# Patient Record
Sex: Male | Born: 1995 | Race: Black or African American | Hispanic: No | Marital: Single | Smoking: Former smoker
Health system: Southern US, Community
[De-identification: ages and names within clinical notes are randomized; demographics above are authoritative.]

## PROBLEM LIST (undated history)

## (undated) DIAGNOSIS — J45909 Unspecified asthma, uncomplicated: Secondary | ICD-10-CM

## (undated) HISTORY — DX: Unspecified asthma, uncomplicated: J45.909

---
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
## 2016-11-18 DIAGNOSIS — J101 Influenza due to other identified influenza virus with other respiratory manifestations: Secondary | ICD-10-CM | POA: Insufficient documentation

## 2018-07-20 ENCOUNTER — Ambulatory Visit: Payer: BLUE CROSS/BLUE SHIELD | Admitting: Family Medicine

## 2018-07-20 ENCOUNTER — Encounter: Payer: Self-pay | Admitting: Family Medicine

## 2018-07-20 VITALS — BP 94/58 | HR 72 | Temp 98.1°F | Ht 67.0 in | Wt 141.0 lb

## 2018-07-20 DIAGNOSIS — Z8709 Personal history of other diseases of the respiratory system: Secondary | ICD-10-CM

## 2018-07-20 DIAGNOSIS — Z0000 Encounter for general adult medical examination without abnormal findings: Secondary | ICD-10-CM

## 2018-07-20 NOTE — Patient Instructions (Signed)
Preventive Care 18-39 Years, Male Preventive care refers to lifestyle choices and visits with your health care provider that can promote health and wellness. What does preventive care include?  A yearly physical exam. This is also called an annual well check.  Dental exams once or twice a year.  Routine eye exams. Ask your health care provider how often you should have your eyes checked.  Personal lifestyle choices, including: ? Daily care of your teeth and gums. ? Regular physical activity. ? Eating a healthy diet. ? Avoiding tobacco and drug use. ? Limiting alcohol use. ? Practicing safe sex. What happens during an annual well check? The services and screenings done by your health care provider during your annual well check will depend on your age, overall health, lifestyle risk factors, and family history of disease. Counseling Your health care provider may ask you questions about your:  Alcohol use.  Tobacco use.  Drug use.  Emotional well-being.  Home and relationship well-being.  Sexual activity.  Eating habits.  Work and work Statistician.  Screening You may have the following tests or measurements:  Height, weight, and BMI.  Blood pressure.  Lipid and cholesterol levels. These may be checked every 5 years starting at age 34.  Diabetes screening. This is done by checking your blood sugar (glucose) after you have not eaten for a while (fasting).  Skin check.  Hepatitis C blood test.  Hepatitis B blood test.  Sexually transmitted disease (STD) testing.  Discuss your test results, treatment options, and if necessary, the need for more tests with your health care provider. Vaccines Your health care provider may recommend certain vaccines, such as:  Influenza vaccine. This is recommended every year.  Tetanus, diphtheria, and acellular pertussis (Tdap, Td) vaccine. You may need a Td booster every 10 years.  Varicella vaccine. You may need this if you  have not been vaccinated.  HPV vaccine. If you are 23 or younger, you may need three doses over 6 months.  Measles, mumps, and rubella (MMR) vaccine. You may need at least one dose of MMR.You may also need a second dose.  Pneumococcal 13-valent conjugate (PCV13) vaccine. You may need this if you have certain conditions and have not been vaccinated.  Pneumococcal polysaccharide (PPSV23) vaccine. You may need one or two doses if you smoke cigarettes or if you have certain conditions.  Meningococcal vaccine. One dose is recommended if you are age 65-21 years and a first-year college student living in a residence hall, or if you have one of several medical conditions. You may also need additional booster doses.  Hepatitis A vaccine. You may need this if you have certain conditions or if you travel or work in places where you may be exposed to hepatitis A.  Hepatitis B vaccine. You may need this if you have certain conditions or if you travel or work in places where you may be exposed to hepatitis B.  Haemophilus influenzae type b (Hib) vaccine. You may need this if you have certain risk factors.  Talk to your health care provider about which screenings and vaccines you need and how often you need them. This information is not intended to replace advice given to you by your health care provider. Make sure you discuss any questions you have with your health care provider. Document Released: 11/29/2001 Document Revised: 06/22/2016 Document Reviewed: 08/04/2015 Elsevier Interactive Patient Education  [employer].

## 2018-07-20 NOTE — Progress Notes (Signed)
Subjective:     Leonard Pham is a 22 y.o. male and is here for a comprehensive physical exam. The patient reports problems - h/o asthma.  Pt notes prior h/o asthma, no recent symptoms.  Pt had an albuterol inhaler, but is not using it.  Pt is hoping to enlist in [employer] next year.  Per pt he needs a methacholine challenge test in order to proceed.   Allergies: NKDA  Social hx:  Pt is single.  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  Pt endorses rare EtOH use.  Pt denies tobacco and drug use.  Family medical history: Mom-alive, asthma Dad-alive, HTN MGM-at arthritis, HLD MGF-asthma PGM-HLD, HTN PGF-HLD, HTN  Social History   Socioeconomic History  . Marital status: Single    Spouse name: Not on file  . Number of children: Not on file  . Years of education: Not on file  . Highest education level: Not on file  Occupational History  . Not on file  Social Needs  . Financial resource strain: Not on file  . Food insecurity:    Worry: Not on file    Inability: Not on file  . Transportation needs:    Medical: Not on file    Non-medical: Not on file  Tobacco Use  . Smoking status: Never Smoker  . Smokeless tobacco: Never Used  Substance and Sexual Activity  . Alcohol use: Yes    Alcohol/week: 0.0 standard drinks  . Drug use: No  . Sexual activity: Not on file  Lifestyle  . Physical activity:    Days per week: Not on file    Minutes per session: Not on file  . Stress: Not on file  Relationships  . Social connections:    Talks on phone: Not on file    Gets together: Not on file    Attends religious service: Not on file    Active member of club or organization: Not on file    Attends meetings of clubs or organizations: Not on file    Relationship status: Not on file  . Intimate partner violence:    Fear of current or ex partner: Not on file    Emotionally abused: Not on file    Physically abused: Not on file    Forced sexual activity: Not on  file  Other Topics Concern  . Not on file  Social History Narrative  . Not on file   Health Maintenance  Topic Date Due  . HIV Screening  03/01/2012  . INFLUENZA VACCINE  05/17/2018  . TETANUS/TDAP  05/12/2025    The following portions of the patient's history were reviewed and updated as appropriate: allergies, current medications, past family history, past medical history, past social history, past surgical history and problem list.  Review of Systems Pertinent items noted in HPI and remainder of comprehensive ROS otherwise negative.   Objective:    BP (!) 94/58 (BP Location: Left Arm, Patient Position: Sitting, Cuff Size: Normal)   Pulse 72   Temp 98.1 F (36.7 C) (Oral)   Ht 5\' 7"  (1.702 m)   Wt 141 lb (64 kg)   SpO2 98%   BMI 22.08 kg/m  General appearance: alert, cooperative, appears stated age and no distress Head: Normocephalic, without obvious abnormality, atraumatic Eyes: conjunctivae/corneas clear. PERRL, EOM's intact. Fundi benign. Ears: normal TM's and external ear canals both ears Nose: Nares normal. Septum midline. Mucosa normal. No drainage or sinus tenderness., no sinus  tenderness Throat: lips, mucosa, and tongue normal; teeth and gums normal Neck: no adenopathy, no carotid bruit, no JVD, supple, symmetrical, trachea midline and thyroid not enlarged, symmetric, no tenderness/mass/nodules Lungs: clear to auscultation bilaterally Heart: regular rate and rhythm, S1, S2 normal, no murmur, click, rub or gallop Abdomen: soft, non-tender; bowel sounds normal; no masses,  no organomegaly Extremities: extremities normal, atraumatic, no cyanosis or edema Skin: Skin color, texture, turgor normal. No rashes or lesions Neurologic: Alert and oriented X 3, normal strength and tone. Normal symmetric reflexes. Normal coordination and gait    Assessment:    Healthy male exam.      Plan:     Anticipatory guidance given including wearing seatbelts, smoke detectors in the  home, increasing physical activity, increasing p.o. intake of water and vegetables. -declined influenza vaccine -given handout -next CPE in 1 yr See After Visit Summary for Counseling Recommendations    H/o Asthma  -referral to pulm placed for methacholine challenge testing or other testing required for pt to enlist in the [employer].  F/u prn  Abbe Amsterdam, MD

## 2018-07-27 ENCOUNTER — Encounter: Payer: Self-pay | Admitting: Pulmonary Disease

## 2018-07-27 ENCOUNTER — Ambulatory Visit (INDEPENDENT_AMBULATORY_CARE_PROVIDER_SITE_OTHER): Payer: BLUE CROSS/BLUE SHIELD | Admitting: Pulmonary Disease

## 2018-07-27 VITALS — BP 104/70 | HR 50 | Ht 67.0 in | Wt 142.4 lb

## 2018-07-27 DIAGNOSIS — R05 Cough: Secondary | ICD-10-CM | POA: Diagnosis not present

## 2018-07-27 DIAGNOSIS — R059 Cough, unspecified: Secondary | ICD-10-CM

## 2018-07-27 LAB — NITRIC OXIDE: Nitric Oxide: 35

## 2018-07-27 NOTE — Patient Instructions (Signed)
We will schedule you for a methacholine challenge for evaluation of asthma I will give you a call with results.  Based on what it shows we we will decide if you will need a follow-up clinic visit.

## 2018-07-27 NOTE — Progress Notes (Signed)
Siddhartha Hoback Racine III    [id number]    1996/07/23  Primary Care Physician:Banks, Bettey Mare, MD  Referring Physician: Deeann Saint, MD [address] [place], [place] [address]  Chief complaint: Consult for asthma  HPI: 22 year old with diagnosis of childhood asthma.  Previously maintained on albuterol rescue inhaler but has not used it for many years No symptoms at present.  Denies any dyspnea, cough, wheezing, congestion.  No seasonal allergies or acid reflux. He is interested in joining [employer] and was told he needed a methacholine challenge test to be cleared.  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 Exposures: No known exposures, hot tub, Jacuzzi Smoking history: No smoking, vaping Travel history: No significant travel Relevant family history: He has family history of asthma.  No outpatient encounter medications on file as of 07/27/2018.   No facility-administered encounter medications on file as of 07/27/2018.     Allergies as of 07/27/2018  . (No Known Allergies)    Past Medical History:  Diagnosis Date  . Asthma    had asthma as young child but no symptoms or treatment for 'years'    No past surgical history on file.  Family History  Problem Relation Age of Onset  . Hypertension Maternal Grandmother   . Heart disease Paternal Grandfather     Social History   Socioeconomic History  . Marital status: Single    Spouse name: Not on file  . Number of children: Not on file  . Years of education: Not on file  . Highest education level: Not on file  Occupational History  . Not on file  Social Needs  . Financial resource strain: Not on file  . Food insecurity:    Worry: Not on file    Inability: Not on file  . Transportation needs:    Medical: Not on file    Non-medical: Not on file  Tobacco Use  . Smoking status: Never Smoker  . Smokeless tobacco: Never Used  Substance  and Sexual Activity  . Alcohol use: Yes    Alcohol/week: 0.0 standard drinks  . Drug use: No  . Sexual activity: Not on file  Lifestyle  . Physical activity:    Days per week: Not on file    Minutes per session: Not on file  . Stress: Not on file  Relationships  . Social connections:    Talks on phone: Not on file    Gets together: Not on file    Attends religious service: Not on file    Active member of club or organization: Not on file    Attends meetings of clubs or organizations: Not on file    Relationship status: Not on file  . Intimate partner violence:    Fear of current or ex partner: Not on file    Emotionally abused: Not on file    Physically abused: Not on file    Forced sexual activity: Not on file  Other Topics Concern  . Not on file  Social History Narrative  . Not on file    Review of systems: Review of Systems  Constitutional: Negative for fever and chills.  HENT: Negative.   Eyes: Negative for blurred vision.  Respiratory: as per HPI  Cardiovascular: Negative for chest pain and palpitations.  Gastrointestinal: Negative for vomiting, diarrhea, blood per rectum. Genitourinary: Negative for dysuria, urgency, frequency and hematuria.  Musculoskeletal: Negative for myalgias,  back pain and joint pain.  Skin: Negative for itching and rash.  Neurological: Negative for dizziness, tremors, focal weakness, seizures and loss of consciousness.  Endo/Heme/Allergies: Negative for environmental allergies.  Psychiatric/Behavioral: Negative for depression, suicidal ideas and hallucinations.  All other systems reviewed and are negative.  Physical Exam: Blood pressure 104/70, pulse (!) 50, height 5\' 7"  (1.702 m), weight 142 lb 6.4 oz (64.6 kg), SpO2 100 %. Gen:      No acute distress HEENT:  EOMI, sclera anicteric Neck:     No masses; no thyromegaly Lungs:    Clear to auscultation bilaterally; normal respiratory effort CV:         Regular rate and rhythm; no  murmurs Abd:      + bowel sounds; soft, non-tender; no palpable masses, no distension Ext:    No edema; adequate peripheral perfusion Skin:      Warm and dry; no rash Neuro: alert and oriented x 3 Psych: normal mood and affect  Data Reviewed: Imaging: Chest x-ray 07/11/2006-confluent posterior right lower lobe.  I have reviewed the images personally.  PFTs: Pending  FENO 07/27/2018- 35  Assessment:  Evaluation for asthma Needs a methacholine challenge test to be able to join the [employer]  He is currently asymptomatic and does not need any treatment.  Chilton Greathouse MD [facility] 07/27/2018, 9:49 AM  CC: Deeann Saint, MD

## 2018-08-17 ENCOUNTER — Ambulatory Visit (HOSPITAL_COMMUNITY)
Admission: RE | Admit: 2018-08-17 | Discharge: 2018-08-17 | Disposition: A | Discharge status: Home / Self Care | Payer: BLUE CROSS/BLUE SHIELD | Source: Ambulatory Visit | Attending: Pulmonary Disease | Admitting: Pulmonary Disease

## 2018-08-17 DIAGNOSIS — R05 Cough: Secondary | ICD-10-CM | POA: Diagnosis present

## 2018-08-17 DIAGNOSIS — R059 Cough, unspecified: Secondary | ICD-10-CM

## 2018-08-17 LAB — PULMONARY FUNCTION TEST
FEF 25-75 Post: 3.28 L/sec
FEF 25-75 Pre: 2.4 L/sec
FEF2575-%Change-Post: 36 %
FEF2575-%Pred-Post: 76 %
FEF2575-%Pred-Pre: 56 %
FEV1-%Change-Post: 14 %
FEV1-%Pred-Post: 92 %
FEV1-%Pred-Pre: 80 %
FEV1-Post: 3.4 L
FEV1-Pre: 2.98 L
FEV1FVC-%Change-Post: 9 %
FEV1FVC-%Pred-Pre: 84 %
FEV6-%Change-Post: 5 %
FEV6-%Pred-Post: 100 %
FEV6-%Pred-Pre: 95 %
FEV6-Post: 4.29 L
FEV6-Pre: 4.08 L
FEV6FVC-%Pred-Post: 100 %
FEV6FVC-%Pred-Pre: 100 %
FVC-%Change-Post: 4 %
FVC-%Pred-Post: 100 %
FVC-%Pred-Pre: 96 %
FVC-Post: 4.29 L
FVC-Pre: 4.12 L
Post FEV1/FVC ratio: 79 %
Post FEV6/FVC ratio: 100 %
Pre FEV1/FVC ratio: 72 %
Pre FEV6/FVC Ratio: 100 %

## 2018-08-17 MED ORDER — METHACHOLINE 1 MG/ML NEB SOLN
2.0000 mL | Freq: Once | RESPIRATORY_TRACT | Status: AC
  Administered 2018-08-17: 2 mg via RESPIRATORY_TRACT

## 2018-08-17 MED ORDER — METHACHOLINE 4 MG/ML NEB SOLN
2.0000 mL | Freq: Once | RESPIRATORY_TRACT | Status: AC
  Administered 2018-08-17: 8 mg via RESPIRATORY_TRACT

## 2018-08-17 MED ORDER — ALBUTEROL SULFATE (2.5 MG/3ML) 0.083% IN NEBU
2.5000 mg | INHALATION_SOLUTION | Freq: Once | RESPIRATORY_TRACT | Status: AC
  Administered 2018-08-17: 2.5 mg via RESPIRATORY_TRACT

## 2018-08-17 MED ORDER — SODIUM CHLORIDE 0.9 % IN NEBU
3.0000 mL | INHALATION_SOLUTION | Freq: Once | RESPIRATORY_TRACT | Status: AC
  Administered 2018-08-17: 3 mL via RESPIRATORY_TRACT

## 2018-08-17 MED ORDER — METHACHOLINE 0.25 MG/ML NEB SOLN
2.0000 mL | Freq: Once | RESPIRATORY_TRACT | Status: AC
  Administered 2018-08-17: 0.5 mg via RESPIRATORY_TRACT

## 2018-08-17 MED ORDER — METHACHOLINE 0.0625 MG/ML NEB SOLN
2.0000 mL | Freq: Once | RESPIRATORY_TRACT | Status: AC
  Administered 2018-08-17: 0.125 mg via RESPIRATORY_TRACT

## 2018-08-17 MED ORDER — METHACHOLINE 16 MG/ML NEB SOLN
2.0000 mL | Freq: Once | RESPIRATORY_TRACT | Status: AC
  Administered 2018-08-17: 32 mg via RESPIRATORY_TRACT

## 2018-08-20 ENCOUNTER — Telehealth: Payer: Self-pay | Admitting: Pulmonary Disease

## 2018-08-20 NOTE — Telephone Encounter (Signed)
Called patient unable to reach left message to give us a call back.

## 2018-08-21 NOTE — Telephone Encounter (Signed)
Pt is requesting PFT results from 08/17/18.  Dr. Isaiah Serge please advise. Thanks

## 2018-08-22 NOTE — Telephone Encounter (Signed)
Pt is calling back [phone]

## 2018-08-22 NOTE — Telephone Encounter (Addendum)
Called and spoke to pt, who was calling for update on results.  Pt made aware that Dr. Isaiah Serge is currently out of the office, and our office will call with results once resulted by Dr. Isaiah Serge.  Pt voiced his understanding and had no further questions.

## 2018-08-27 NOTE — Telephone Encounter (Signed)
Please let him know that the PFTs are negative for asthma. Ask him where he wants [place] to send the result?

## 2018-08-28 NOTE — Telephone Encounter (Signed)
Spoke with pt, advised message from Dr. Isaiah SergeMannam. He requested the results be mailed to his address at school. Nothing further is needed.   [address] [place] [address] [place], [id number]

## 2019-08-19 ENCOUNTER — Encounter: Payer: Self-pay | Admitting: Infectious Diseases

## 2019-10-03 ENCOUNTER — Ambulatory Visit: Payer: BC Managed Care – PPO

## 2019-10-03 ENCOUNTER — Other Ambulatory Visit: Payer: Self-pay

## 2019-10-03 ENCOUNTER — Other Ambulatory Visit: Payer: BC Managed Care – PPO

## 2019-10-03 DIAGNOSIS — Z113 Encounter for screening for infections with a predominantly sexual mode of transmission: Secondary | ICD-10-CM

## 2019-10-03 DIAGNOSIS — B20 Human immunodeficiency virus [HIV] disease: Secondary | ICD-10-CM

## 2019-10-03 DIAGNOSIS — Z79899 Other long term (current) drug therapy: Secondary | ICD-10-CM

## 2019-10-03 LAB — URINALYSIS
Bilirubin Urine: NEGATIVE
Glucose, UA: NEGATIVE
Hgb urine dipstick: NEGATIVE
Ketones, ur: NEGATIVE
Leukocytes,Ua: NEGATIVE
Nitrite: NEGATIVE
Protein, ur: NEGATIVE
Specific Gravity, Urine: 1.027 (ref 1.001–1.03)
pH: 6.5 (ref 5.0–8.0)

## 2019-10-04 LAB — T-HELPER CELL (CD4) - (RCID CLINIC ONLY)
CD4 % Helper T Cell: 9 % — ABNORMAL LOW (ref 33–65)
CD4 T Cell Abs: 277 /uL — ABNORMAL LOW (ref 400–1790)

## 2019-10-19 LAB — COMPLETE METABOLIC PANEL WITH GFR
AG Ratio: 0.9 (calc) — ABNORMAL LOW (ref 1.0–2.5)
ALT: 29 U/L (ref 9–46)
AST: 44 U/L — ABNORMAL HIGH (ref 10–40)
Albumin: 4.1 g/dL (ref 3.6–5.1)
Alkaline phosphatase (APISO): 53 U/L (ref 36–130)
BUN: 12 mg/dL (ref 7–25)
CO2: 29 mmol/L (ref 20–32)
Calcium: 9.6 mg/dL (ref 8.6–10.3)
Chloride: 101 mmol/L (ref 98–110)
Creat: 0.92 mg/dL (ref 0.60–1.35)
GFR, Est African American: 136 mL/min/{1.73_m2} (ref >=60)
GFR, Est Non African American: 118 mL/min/{1.73_m2} (ref >=60)
Globulin: 4.4 g/dL (calc) — ABNORMAL HIGH (ref 1.9–3.7)
Glucose, Bld: 93 mg/dL (ref 65–99)
Potassium: 4.4 mmol/L (ref 3.5–5.3)
Sodium: 138 mmol/L (ref 135–146)
Total Bilirubin: 0.7 mg/dL (ref 0.2–1.2)
Total Protein: 8.5 g/dL — ABNORMAL HIGH (ref 6.1–8.1)

## 2019-10-19 LAB — CBC WITH DIFFERENTIAL/PLATELET
Absolute Monocytes: 415 cells/uL (ref 200–950)
Basophils Absolute: 18 cells/uL (ref 0–200)
Basophils Relative: 0.3 %
Eosinophils Absolute: 31 cells/uL (ref 15–500)
Eosinophils Relative: 0.5 %
HCT: 45.3 % (ref 38.5–50.0)
Hemoglobin: 14.9 g/dL (ref 13.2–17.1)
Lymphs Abs: 3849 cells/uL (ref 850–3900)
MCH: 28.1 pg (ref 27.0–33.0)
MCHC: 32.9 g/dL (ref 32.0–36.0)
MCV: 85.3 fL (ref 80.0–100.0)
MPV: 10.5 fL (ref 7.5–12.5)
Monocytes Relative: 6.8 %
Neutro Abs: 1787 cells/uL (ref 1500–7800)
Neutrophils Relative %: 29.3 %
Platelets: 246 10*3/uL (ref 140–400)
RBC: 5.31 10*6/uL (ref 4.20–5.80)
RDW: 13.5 % (ref 11.0–15.0)
Total Lymphocyte: 63.1 %
WBC: 6.1 10*3/uL (ref 3.8–10.8)

## 2019-10-19 LAB — HIV-1 GENOTYPE: HIV-1 Genotype: DETECTED — AB

## 2019-10-19 LAB — HEPATITIS B SURFACE ANTIGEN: Hepatitis B Surface Ag: NONREACTIVE

## 2019-10-19 LAB — LIPID PANEL
Cholesterol: 160 mg/dL (ref <200)
HDL: 37 mg/dL — ABNORMAL LOW (ref >=40)
LDL Cholesterol (Calc): 108 mg/dL (calc) — ABNORMAL HIGH
Non-HDL Cholesterol (Calc): 123 mg/dL (calc) (ref <130)
Total CHOL/HDL Ratio: 4.3 (calc) (ref <5.0)
Triglycerides: 64 mg/dL (ref <150)

## 2019-10-19 LAB — HIV-1 RNA ULTRAQUANT REFLEX TO GENTYP+
HIV 1 RNA Quant: 99300 copies/mL — ABNORMAL HIGH
HIV-1 RNA Quant, Log: 5 Log copies/mL — ABNORMAL HIGH

## 2019-10-19 LAB — QUANTIFERON-TB GOLD PLUS
Mitogen-NIL: >10 IU/mL
NIL: 0.06 IU/mL
QuantiFERON-TB Gold Plus: NEGATIVE
TB1-NIL: 0 IU/mL
TB2-NIL: 0 IU/mL

## 2019-10-19 LAB — HIV-1/2 AB - DIFFERENTIATION
HIV-1 antibody: POSITIVE — AB
HIV-2 Ab: NEGATIVE

## 2019-10-19 LAB — FLUORESCENT TREPONEMAL AB(FTA)-IGG-BLD: Fluorescent Treponemal ABS: REACTIVE — AB

## 2019-10-19 LAB — HIV ANTIBODY (ROUTINE TESTING W REFLEX): HIV 1&2 Ab, 4th Generation: REACTIVE — AB

## 2019-10-19 LAB — HEPATITIS A ANTIBODY, TOTAL: Hepatitis A AB,Total: REACTIVE — AB

## 2019-10-19 LAB — HEPATITIS C ANTIBODY
Hepatitis C Ab: NONREACTIVE
SIGNAL TO CUT-OFF: 0.09 (ref <1.00)

## 2019-10-19 LAB — HLA B*5701: HLA-B*5701 w/rflx HLA-B High: NEGATIVE

## 2019-10-19 LAB — HEPATITIS B SURFACE ANTIBODY,QUALITATIVE: Hep B S Ab: BORDERLINE — AB

## 2019-10-19 LAB — RPR TITER: RPR Titer: 1:64 {titer} — ABNORMAL HIGH

## 2019-10-19 LAB — HEPATITIS B CORE ANTIBODY, TOTAL: Hep B Core Total Ab: NONREACTIVE

## 2019-10-19 LAB — RPR: RPR Ser Ql: REACTIVE — AB

## 2019-10-29 ENCOUNTER — Encounter: Payer: Self-pay | Admitting: Infectious Diseases

## 2019-10-29 ENCOUNTER — Other Ambulatory Visit: Payer: Self-pay

## 2019-10-29 ENCOUNTER — Ambulatory Visit: Payer: BC Managed Care – PPO | Admitting: Infectious Diseases

## 2019-10-29 ENCOUNTER — Ambulatory Visit: Payer: BLUE CROSS/BLUE SHIELD | Admitting: Pharmacist

## 2019-10-29 DIAGNOSIS — Z0000 Encounter for general adult medical examination without abnormal findings: Secondary | ICD-10-CM

## 2019-10-29 DIAGNOSIS — B20 Human immunodeficiency virus [HIV] disease: Secondary | ICD-10-CM

## 2019-10-29 DIAGNOSIS — Z21 Asymptomatic human immunodeficiency virus [HIV] infection status: Secondary | ICD-10-CM | POA: Diagnosis not present

## 2019-10-29 HISTORY — DX: Human immunodeficiency virus (HIV) disease: B20

## 2019-10-29 HISTORY — DX: Asymptomatic human immunodeficiency virus (hiv) infection status: Z21

## 2019-10-29 MED ORDER — BIKTARVY 50-200-25 MG PO TABS: 1.0000 | ORAL_TABLET | Freq: Every day | ORAL | 11 refills | Status: DC

## 2019-10-29 NOTE — Assessment & Plan Note (Signed)
Will re-vaccinate with Heplisav and start pneumococcal series with Prevnar now and Pneumovax in 8 weeks. Will check childhood records to see if he has received HPV.

## 2019-10-29 NOTE — Patient Instructions (Addendum)
Nice to meet you today!  Biktarvy is the pill I would like for you to start taking to treat you - this will need to be taken once a day around the same time.  - Common side effects for a short time frame usually include headaches, nausea and diarrhea - OK to take over the counter tylenol for headaches and imodium for diarrhea - Try taking with food if you are nauseated  - If you take any multivitamins or supplements please separate them from your Biktarvy by 6 hours before and after.  The main thing is do not have them in the stomach at the same time.  Please come back in 4 weeks to meet with our pharmacy team -   Please come back to see Colletta Maryland again in 3 months.    Pneumococcal Conjugate Vaccine suspension for injection What is this medicine? PNEUMOCOCCAL VACCINE (NEU mo KOK al vak SEEN) is a vaccine used to prevent pneumococcus bacterial infections. These bacteria can cause serious infections like pneumonia, meningitis, and blood infections. This vaccine will lower your chance of getting pneumonia. If you do get pneumonia, it can make your symptoms milder and your illness shorter. This vaccine will not treat an infection and will not cause infection. This vaccine is recommended for infants and young children, adults with certain medical conditions, and adults 44 years or older. This medicine may be used for other purposes; ask your health care provider or pharmacist if you have questions. COMMON BRAND NAME(S): Prevnar, Prevnar 13 What should I tell my health care provider before I take this medicine? They need to know if you have any of these conditions:  bleeding problems  fever  immune system problems  an unusual or allergic reaction to pneumococcal vaccine, diphtheria toxoid, other vaccines, latex, other medicines, foods, dyes, or preservatives  pregnant or trying to get pregnant  breast-feeding How should I use this medicine? This vaccine is for injection into a muscle. It  is given by a health care professional. A copy of Vaccine Information Statements will be given before each vaccination. Read this sheet carefully each time. The sheet may change frequently. Talk to your pediatrician regarding the use of this medicine in children. While this drug may be prescribed for children as young as 31 weeks old for selected conditions, precautions do apply. Overdosage: If you think you have taken too much of this medicine contact a poison control center or emergency room at once. NOTE: This medicine is only for you. Do not share this medicine with others. What if I miss a dose? It is important not to miss your dose. Call your doctor or health care professional if you are unable to keep an appointment. What may interact with this medicine?  medicines for cancer chemotherapy  medicines that suppress your immune function  steroid medicines like prednisone or cortisone This list may not describe all possible interactions. Give your health care provider a list of all the medicines, herbs, non-prescription drugs, or dietary supplements you use. Also tell them if you smoke, drink alcohol, or use illegal drugs. Some items may interact with your medicine. What should I watch for while using this medicine? Mild fever and pain should go away in 3 days or less. Report any unusual symptoms to your doctor or health care professional. What side effects may I notice from receiving this medicine? Side effects that you should report to your doctor or health care professional as soon as possible:  allergic reactions like skin  rash, itching or hives, swelling of the face, lips, or tongue  breathing problems  confused  fast or irregular heartbeat  fever over 102 degrees F  seizures  unusual bleeding or bruising  unusual muscle weakness Side effects that usually do not require medical attention (report to your doctor or health care professional if they continue or are  bothersome):  aches and pains  diarrhea  fever of 102 degrees F or less  headache  irritable  loss of appetite  pain, tender at site where injected  trouble sleeping This list may not describe all possible side effects. Call your doctor for medical advice about side effects. You may report side effects to FDA at 1-800-FDA-1088. Where should I keep my medicine? This does not apply. This vaccine is given in a clinic, pharmacy, doctor's office, or other health care setting and will not be stored at home. NOTE: This sheet is a summary. It may not cover all possible information. If you have questions about this medicine, talk to your doctor, pharmacist, or health care provider.  2020 Elsevier/Gold Standard (2014-07-10 10:27:27)   Hepatitis B Vaccine, Recombinant injection What is this medicine? HEPATITIS B VACCINE (hep uh TAHY tis B VAK seen) is a vaccine. It is used to prevent an infection with the hepatitis B virus. This medicine may be used for other purposes; ask your health care provider or pharmacist if you have questions. COMMON BRAND NAME(S): Engerix-B, Recombivax HB What should I tell my health care provider before I take this medicine? They need to know if you have any of these conditions:  fever, infection  heart disease  hepatitis B infection  immune system problems  kidney disease  an unusual or allergic reaction to vaccines, yeast, other medicines, foods, dyes, or preservatives  pregnant or trying to get pregnant  breast-feeding How should I use this medicine? This vaccine is for injection into a muscle. It is given by a health care professional. A copy of Vaccine Information Statements will be given before each vaccination. Read this sheet carefully each time. The sheet may change frequently. Talk to your pediatrician regarding the use of this medicine in children. While this drug may be prescribed for children as young as newborn for selected conditions,  precautions do apply. Overdosage: If you think you have taken too much of this medicine contact a poison control center or emergency room at once. NOTE: This medicine is only for you. Do not share this medicine with others. What if I miss a dose? It is important not to miss your dose. Call your doctor or health care professional if you are unable to keep an appointment. What may interact with this medicine?  medicines that suppress your immune function like adalimumab, anakinra, infliximab  medicines to treat cancer  steroid medicines like prednisone or cortisone This list may not describe all possible interactions. Give your health care provider a list of all the medicines, herbs, non-prescription drugs, or dietary supplements you use. Also tell them if you smoke, drink alcohol, or use illegal drugs. Some items may interact with your medicine. What should I watch for while using this medicine? See your health care provider for all shots of this vaccine as directed. You must have 3 shots of this vaccine for protection from hepatitis B infection. Tell your doctor right away if you have any serious or unusual side effects after getting this vaccine. What side effects may I notice from receiving this medicine? Side effects that you should report  to your doctor or health care professional as soon as possible:  allergic reactions like skin rash, itching or hives, swelling of the face, lips, or tongue  breathing problems  confused, irritated  fast, irregular heartbeat  flu-like syndrome  numb, tingling pain  seizures  unusually weak or tired Side effects that usually do not require medical attention (report to your doctor or health care professional if they continue or are bothersome):  diarrhea  fever  headache  loss of appetite  muscle pain  nausea  pain, redness, swelling, or irritation at site where injected  tiredness This list may not describe all possible side  effects. Call your doctor for medical advice about side effects. You may report side effects to FDA at 1-800-FDA-1088. Where should I keep my medicine? This drug is given in a hospital or clinic and will not be stored at home. NOTE: This sheet is a summary. It may not cover all possible information. If you have questions about this medicine, talk to your doctor, pharmacist, or health care provider.  2020 Elsevier/Gold Standard (2014-02-03 13:26:01)

## 2019-10-29 NOTE — Progress Notes (Signed)
Name: Leonard Pham  DOB: [birth year] MRN: [id number] PCP: Leonard Ruddy, MD    Brief Narrative:  Leonard Pham is a 24 y.o. male with HIV Diagnosed recently in December 2020.   CD4 nadir 277 VL 99,300K  HIV Risk: MSM History of OIs: none Intake Labs 09/2019: He received Hep B sAg (-), sAb (borderline immune), cAb (-); Hep A (+), Hep C (the -is still doing immunity) Quantiferon (-) HLA B*5701 (-) G6PD: ()   Previous Regimens: . naive  Genotypes: . No mutations 09/2019   Subjective:   Chief Complaint  Patient presents with  . New Patient (Initial Visit)    received flu shot; declined condoms     HPI: Leonard Pham is a 24 y.o. male is here for his first visit to enroll I care for newly diagnosed HIV disease. He and his boyfriend went to donate blood a few months ago and were notified then that they "could not ever donate again because of HIV." They did not receive any discussion from the blood donation back after that. They both went to [facility] for formal testing and received positive results. His boyfriend is already on Biktarvy once a day and Leonard has been awaiting visit today to start on his treatment.   He has researched his condition and has a good understanding about prognosis with and without medications and how it is transmitted.  He was tested for HIV in the past knowing his risk with MSM lifestyle and was negative within the last 1-2 years. He was not able to secure these records however. He was also treated for latent syphilis of unknown duration recently at [facility] with [facility]   He is otherwise a healthy young male with only a pmhx of childhood asthma.  He received vaccinations per schedule as a child here in [place].  It does not look like he is received the HPV series.  He does have a PCP assigned to him.   Review of Systems  Constitutional: Negative for chills, fever, malaise/fatigue and weight loss.    HENT: Negative for sore throat.   Respiratory: Negative for cough, sputum production and shortness of breath.   Cardiovascular: Negative.   Gastrointestinal: Negative for abdominal pain, diarrhea and vomiting.  Musculoskeletal: Negative for joint pain, myalgias and neck pain.  Skin: Negative for rash.  Neurological: Negative for headaches.  Psychiatric/Behavioral: Negative for depression and substance abuse. The patient is not nervous/anxious.     Past Medical History:  Diagnosis Date  . Asthma    had asthma as young child but no symptoms or treatment for 'years'  . HIV (human immunodeficiency virus infection) ([place]) 10/29/2019   Dx 09/2019    No outpatient medications prior to visit.   No facility-administered medications prior to visit.     No Known Allergies  Social History   Tobacco Use  . Smoking status: Never Smoker  . Smokeless tobacco: Never Used  Substance Use Topics  . Alcohol use: Yes    Alcohol/week: 0.0 standard drinks    Comment: socially  . Drug use: Yes    Types: Marijuana    Comment: occassionally     Family History  Problem Relation Age of Onset  . Hypertension Maternal Grandmother   . Heart disease Paternal Grandfather     Social History   Substance and Sexual Activity  Sexual Activity Not Currently  . Partners: Male   Comment: declined 1/21  Objective:   Vitals:   10/29/19 1413  BP: 109/73  Pulse: (!) 114  Weight: 144 lb (65.3 kg)   Body mass index is 22.55 kg/m.  Physical Exam HENT:     Mouth/Throat:     Mouth: No oral lesions.     Dentition: Normal dentition. No dental caries.  Eyes:     General: No scleral icterus. Cardiovascular:     Rate and Rhythm: Normal rate and regular rhythm.     Heart sounds: Normal heart sounds.  Pulmonary:     Effort: Pulmonary effort is normal.     Breath sounds: Normal breath sounds.  Abdominal:     General: There is no distension.     Palpations: Abdomen is soft.     Tenderness:  There is no abdominal tenderness.  Lymphadenopathy:     Cervical: No cervical adenopathy.  Skin:    General: Skin is warm and dry.     Findings: No rash.  Neurological:     Mental Status: He is alert and oriented to person, place, and time.     Lab Results Lab Results  Component Value Date   WBC 6.1 10/03/2019   HGB 14.9 10/03/2019   HCT 45.3 10/03/2019   MCV 85.3 10/03/2019   PLT 246 10/03/2019    Lab Results  Component Value Date   CREATININE 0.92 10/03/2019   BUN 12 10/03/2019   NA 138 10/03/2019   K 4.4 10/03/2019   CL 101 10/03/2019   CO2 29 10/03/2019    Lab Results  Component Value Date   ALT 29 10/03/2019   AST 44 (H) 10/03/2019   BILITOT 0.7 10/03/2019    Lab Results  Component Value Date   CHOL 160 10/03/2019   HDL 37 (L) 10/03/2019   LDLCALC 108 (H) 10/03/2019   TRIG 64 10/03/2019   CHOLHDL 4.3 10/03/2019   HIV 1 RNA Quant (copies/mL)  Date Value  10/03/2019 99,300 (H)   CD4 T Cell Abs (/uL)  Date Value  10/03/2019 277 (L)     Assessment & Plan:   Problem List Items Addressed This Visit      Unprioritized   HIV (human immunodeficiency virus infection) ([place]) (Chronic)    New patient here to establish for HIV care. Treatment naive and ready to start therapy.   I discussed with Leonard Pham treatment options/side effects, benefits of treatment and long-term outcomes. I discussed how HIV is transmitted and the process of untreated HIV including increased risk for opportunistic infections, cancer, dementia and renal failure. Patient was counseled on routine HIV care including medication adherence, blood monitoring, necessary vaccines and follow up visits. Counseled regarding safe sex practices including: condom use, partner disclosure, limiting partners. Patient spent time talking with our pharmacist Terrence Dupont regarding successful practices of ART and understands to reach out to our clinic in the future with questions.   Will start  [place] for HIV treatment.  He is insured through [employer] and will be provided with co-pay assistance today.  He has not yet chosen to disclose to his parents but has contacted his insurance company to route bills and explanation of benefits to him.  He does consider planning on telling them in the future but would like to get his condition under good control first.  General introduction to our clinic and integrated services. Discussed dental care - he has private access for now.   I spent greater than 45 minutes with the patient today. Greater  than 50% of the time spent face-to-face counseling and coordination of care re: HIV and health maintenance.        Relevant Medications   bictegravir-emtricitabine-tenofovir AF (BIKTARVY) 50-200-25 MG TABS tablet   Healthcare maintenance    Will re-vaccinate with Heplisav and start pneumococcal series with Prevnar now and Pneumovax in 8 weeks. Will check childhood records to see if he has received HPV.         He will return to clinic in 4 to 6 weeks to assess adherence and tolerability of his new medication as well as repeat a viral load for therapeutic response.  I would also like to see him again in 3 months to continue early education and monitoring.  Janene Madeira, MSN, NP-C [facility] Pager: [phone] Office: [phone]  11/05/19  1:26 PM

## 2019-11-05 NOTE — Assessment & Plan Note (Signed)
New patient here to establish for HIV care. Treatment naive and ready to start therapy.   I discussed with Leonard Pham treatment options/side effects, benefits of treatment and long-term outcomes. I discussed how HIV is transmitted and the process of untreated HIV including increased risk for opportunistic infections, cancer, dementia and renal failure. Patient was counseled on routine HIV care including medication adherence, blood monitoring, necessary vaccines and follow up visits. Counseled regarding safe sex practices including: condom use, partner disclosure, limiting partners. Patient spent time talking with our pharmacist Kara Mead regarding successful practices of ART and understands to reach out to our clinic in the future with questions.   Will start BIKTARVY for HIV treatment.  He is insured through [employer] and will be provided with co-pay assistance today.  He has not yet chosen to disclose to his parents but has contacted his insurance company to route bills and explanation of benefits to him.  He does consider planning on telling them in the future but would like to get his condition under good control first.  General introduction to our clinic and integrated services. Discussed dental care - he has private access for now.   I spent greater than 45 minutes with the patient today. Greater than 50% of the time spent face-to-face counseling and coordination of care re: HIV and health maintenance.

## 2019-11-19 ENCOUNTER — Encounter: Payer: Self-pay | Admitting: Infectious Diseases

## 2019-12-02 ENCOUNTER — Ambulatory Visit (INDEPENDENT_AMBULATORY_CARE_PROVIDER_SITE_OTHER): Payer: BC Managed Care – PPO | Admitting: Pharmacist

## 2019-12-02 ENCOUNTER — Other Ambulatory Visit: Payer: Self-pay

## 2019-12-02 DIAGNOSIS — B20 Human immunodeficiency virus [HIV] disease: Secondary | ICD-10-CM | POA: Diagnosis not present

## 2019-12-02 DIAGNOSIS — Z23 Encounter for immunization: Secondary | ICD-10-CM

## 2019-12-02 NOTE — Progress Notes (Signed)
HPI: Leonard Pham is a 24 y.o. male who presents to the [facility] pharmacy clinic for HIV follow-up.  Patient Active Problem List   Diagnosis Date Noted  . HIV (human immunodeficiency virus infection) ([facility]) 10/29/2019  . Healthcare maintenance 10/29/2019    Patient's Medications  New Prescriptions   No medications on file  Previous Medications   BICTEGRAVIR-EMTRICITABINE-TENOFOVIR AF (BIKTARVY) 50-200-25 MG TABS TABLET    Take 1 tablet by mouth daily.  Modified Medications   No medications on file  Discontinued Medications   No medications on file    Allergies: No Known Allergies  Past Medical History: Past Medical History:  Diagnosis Date  . Asthma    had asthma as young child but no symptoms or treatment for 'years'  . HIV (human immunodeficiency virus infection) ([facility]) 10/29/2019   Dx 09/2019    Social History: Social History   Socioeconomic History  . Marital status: Single    Spouse name: Not on file  . Number of children: Not on file  . Years of education: Not on file  . Highest education level: Not on file  Occupational History  . Not on file  Tobacco Use  . Smoking status: Never Smoker  . Smokeless tobacco: Never Used  Substance and Sexual Activity  . Alcohol use: Yes    Alcohol/week: 0.0 standard drinks    Comment: socially  . Drug use: Yes    Types: Marijuana    Comment: occassionally   . Sexual activity: Not Currently    Partners: Male    Comment: declined 1/21  Other Topics Concern  . Not on file  Social History Narrative  . Not on file   Social Determinants of Health   Financial Resource Strain:   . Difficulty of Paying Living Expenses: Not on file  Food Insecurity:   . Worried About Programme researcher, broadcasting/film/video in the Last Year: Not on file  . Ran Out of Food in the Last Year: Not on file  Transportation Needs:   . Lack of Transportation (Medical): Not on file  . Lack of Transportation (Non-Medical): Not on file  Physical Activity:    . Days of Exercise per Week: Not on file  . Minutes of Exercise per Session: Not on file  Stress:   . Feeling of Stress : Not on file  Social Connections:   . Frequency of Communication with Friends and Family: Not on file  . Frequency of Social Gatherings with Friends and Family: Not on file  . Attends Religious Services: Not on file  . Active Member of Clubs or Organizations: Not on file  . Attends Banker Meetings: Not on file  . Marital Status: Not on file    Labs: Lab Results  Component Value Date   HIV1RNAQUANT 99,300 (H) 10/03/2019   CD4TABS 277 (L) 10/03/2019    RPR and STI Lab Results  Component Value Date   LABRPR REACTIVE (A) 10/03/2019   RPRTITER 1:64 (H) 10/03/2019    No flowsheet data found.  Hepatitis B Lab Results  Component Value Date   HEPBSAB BORDERLINE (A) 10/03/2019   HEPBSAG NON-REACTIVE 10/03/2019   HEPBCAB NON-REACTIVE 10/03/2019   Hepatitis C Lab Results  Component Value Date   HEPCAB NON-REACTIVE 10/03/2019   Hepatitis A Lab Results  Component Value Date   HAV REACTIVE (A) 10/03/2019   Lipids: Lab Results  Component Value Date   CHOL 160 10/03/2019   TRIG 64 10/03/2019   HDL 37 (  L) 10/03/2019   CHOLHDL 4.3 10/03/2019   LDLCALC 108 (H) 10/03/2019    Current HIV Regimen: Biktarvy  Assessment: Leonard Pham is at clinic for HIV follow-up. Recently diagnosed with HIV.Resk factors: MSM and hx of STIs. Currently taking Biktarvy. Leonard Pham says he is taking his medication every day and is not experiencing any side effects. He says he not currently sexually active. Advised him to use condoms if he does engage in sexual activity. The first hepatitis B vaccine and the PCV-13 vaccine were given today. Will get second hepatitis B vaccination at appintment with Leonard Pham in 4 weeks. Will get PPSV23 at appointment with Leonard Pham in 8 weeks.  Plan: - Labs: HIV RNA, CD4 count - Follow-up with Leonard Pham on March 16 at 1:45 pm   - Follow-up  with Leonard Pham on April 12 at 1:45 pm  Leonard Pham, 4th Year PharmD Candidate

## 2019-12-03 LAB — T-HELPER CELL (CD4) - (RCID CLINIC ONLY)
CD4 % Helper T Cell: 12 % — ABNORMAL LOW (ref 33–65)
CD4 T Cell Abs: 357 /uL — ABNORMAL LOW (ref 400–1790)

## 2019-12-05 LAB — HIV-1 RNA QUANT-NO REFLEX-BLD
HIV 1 RNA Quant: 61 copies/mL — ABNORMAL HIGH
HIV-1 RNA Quant, Log: 1.79 Log copies/mL — ABNORMAL HIGH

## 2019-12-31 ENCOUNTER — Other Ambulatory Visit: Payer: Self-pay

## 2019-12-31 ENCOUNTER — Ambulatory Visit (INDEPENDENT_AMBULATORY_CARE_PROVIDER_SITE_OTHER): Payer: BC Managed Care – PPO | Admitting: Pharmacist

## 2019-12-31 DIAGNOSIS — B20 Human immunodeficiency virus [HIV] disease: Secondary | ICD-10-CM | POA: Diagnosis not present

## 2019-12-31 DIAGNOSIS — Z23 Encounter for immunization: Secondary | ICD-10-CM

## 2019-12-31 NOTE — Progress Notes (Addendum)
   [facility] Vaccination Visit  HPI: Leonard Pham is a 24 y.o. male here for his 2nd and final Hepatitis B vaccine.  Hepatitis B Lab Results  Component Value Date   HEPBSAB BORDERLINE (A) 10/03/2019   Lab Results  Component Value Date   HEPBSAG NON-REACTIVE 10/03/2019    Hepatitis C No results found for: HCVAB  Hepatitis A Lab Results  Component Value Date   HAV REACTIVE (A) 10/03/2019    Assessment: Leonard Pham is here today for his 2nd Hepatitis B vaccine. Given in right arm, tolerated well. He had no issues with first dose.   He is worried that he is not undetectable yet (viral load was 61 when checked ~4 weeks after starting treatment). I told him that it was well on its way and would likely be undetectable soon, but he wanted to recheck today. He is taking his Biktarvy every day without a single missed dose since starting. He is having some "temperature changes" where he is really cold or really hot at random times. I told him that it wasn't likely due to Leonard Pham but to mention it to Leonard Pham when he sees her next month.    He is also asking about the HPV vaccine, which I encouraged him to get. Per Leonard Pham's last note, she was going to have him check or check his childhood vaccine history to see if he ever received it. Will defer that to her. He also prefers not to get 2 shots today.   Plan: - Hepatitis B vaccine #2/2 - HIV viral load - Continue Biktarvy daily - F/u with Leonard Pham 4/12 at 145pm  Leonard Pham L. Delana Manganello, PharmD, BCIDP, AAHIVP, CPP Clinical Pharmacist Practitioner Infectious Diseases Clinical Pharmacist [facility] 12/31/2019, 2:17 PM

## 2020-01-02 LAB — HIV-1 RNA QUANT-NO REFLEX-BLD
HIV 1 RNA Quant: 31 copies/mL — ABNORMAL HIGH
HIV-1 RNA Quant, Log: 1.49 Log copies/mL — ABNORMAL HIGH

## 2020-01-24 ENCOUNTER — Telehealth: Payer: Self-pay

## 2020-01-24 NOTE — Telephone Encounter (Signed)
COVID-19 Pre-Screening Questions:01/24/20  Do you currently have a fever (>100 F), chills or unexplained body aches?NO   Are you currently experiencing new cough, shortness of breath, sore throat, runny nose?NO  .  Have you recently travelled outside the state of [place] in the last 14 days? NO  .  Have you been in contact with someone that is currently pending confirmation of Covid19 testing or has been confirmed to have the Covid19 virus? NO  **If the patient answers NO to ALL questions -  advise the patient to please call the clinic before coming to the office should any symptoms develop.     

## 2020-01-27 ENCOUNTER — Other Ambulatory Visit: Payer: Self-pay

## 2020-01-27 ENCOUNTER — Ambulatory Visit (INDEPENDENT_AMBULATORY_CARE_PROVIDER_SITE_OTHER): Payer: BC Managed Care – PPO | Admitting: Infectious Diseases

## 2020-01-27 ENCOUNTER — Encounter: Payer: Self-pay | Admitting: Infectious Diseases

## 2020-01-27 DIAGNOSIS — Z0000 Encounter for general adult medical examination without abnormal findings: Secondary | ICD-10-CM | POA: Diagnosis not present

## 2020-01-27 DIAGNOSIS — Z21 Asymptomatic human immunodeficiency virus [HIV] infection status: Secondary | ICD-10-CM | POA: Diagnosis not present

## 2020-01-27 NOTE — Progress Notes (Signed)
Subjective:    Patient ID: Leonard Pham is a 24 y.o. male     DOB: Nov 01, 1995   MRN: [id number]   Patient Active Problem List   Diagnosis Date Noted  . HIV (human immunodeficiency virus infection) ([place]) 10/29/2019  . Healthcare maintenance 10/29/2019     No chief complaint on file.     HPI Leonard Pham is doing well, he reports good adherence with his Biktarvy. He denied any side effects. He did report that pharmacy notified him of a possible delay of getting his medication. A week sample of Biktarvy was given while this issue resolves.   He denied any new complaints. He stated that the new diagnosis of HIV is now starting to settle in for him but denied feelings of being down, depressed or hopeless. Informed patient about counseling being available at the clinic if he needs it.   Discussed Cabenuva injection being possible once he been on ART for at least 6 months and being undetectable during that time. Patient will consider.   Declined needing condoms today, he reported not being sexually active with his partner who is also HIV positive.   Reviewed last lab results and their significance.   Will delay pneumovax 23 vaccine due to receiving 1st dose of Moderna COVID vaccine on 01/17/20.    Review of Systems  Constitutional: Negative for chills, fever and malaise/fatigue.  Respiratory: Negative for cough, shortness of breath and wheezing.   Cardiovascular: Negative for chest pain, palpitations and leg swelling.  Gastrointestinal: Negative for abdominal pain, nausea and vomiting.  Musculoskeletal: Negative for myalgias.  Skin: Negative for rash.  Neurological: Negative for weakness and headaches.  Psychiatric/Behavioral: Negative for depression. The patient is not nervous/anxious.     Outpatient Medications Prior to Visit  Medication Sig Dispense Refill  . bictegravir-emtricitabine-tenofovir AF (BIKTARVY) 50-200-25 MG TABS tablet Take 1 tablet by mouth daily. 30  tablet 11   No facility-administered medications prior to visit.    Past Medical History:  Diagnosis Date  . Asthma    had asthma as young child but no symptoms or treatment for 'years'  . HIV (human immunodeficiency virus infection) ([place]) 10/29/2019   Dx 09/2019    Family History  Problem Relation Age of Onset  . Hypertension Maternal Grandmother   . Heart disease Paternal Grandfather     Social History   Socioeconomic History  . Marital status: Single    Spouse name: Not on file  . Number of children: Not on file  . Years of education: Not on file  . Highest education level: Not on file  Occupational History  . Not on file  Tobacco Use  . Smoking status: Never Smoker  . Smokeless tobacco: Never Used  Substance and Sexual Activity  . Alcohol use: Yes    Alcohol/week: 0.0 standard drinks    Comment: socially  . Drug use: Yes    Types: Marijuana    Comment: occassionally   . Sexual activity: Not Currently    Partners: Male    Comment: declined 1/21  Other Topics Concern  . Not on file  Social History Narrative  . Not on file   Social Determinants of Health   Financial Resource Strain:   . Difficulty of Paying Living Expenses:   Food Insecurity:   . Worried About Charity fundraiser in the Last Year:   . Arboriculturist in the Last Year:   Transportation Needs:   . Lack of Transportation (  Medical):   Marland Kitchen Lack of Transportation (Non-Medical):   Physical Activity:   . Days of Exercise per Week:   . Minutes of Exercise per Session:   Stress:   . Feeling of Stress :   Social Connections:   . Frequency of Communication with Friends and Family:   . Frequency of Social Gatherings with Friends and Family:   . Attends Religious Services:   . Active Member of Clubs or Organizations:   . Attends Banker Meetings:   Marland Kitchen Marital Status:   Intimate Partner Violence:   . Fear of Current or Ex-Partner:   . Emotionally Abused:   Marland Kitchen Physically Abused:   .  Sexually Abused:          Objective:    Today's Vitals   01/27/20 1348  BP: 119/79  Pulse: 66  Temp: 98.3 F (36.8 C)  TempSrc: Oral  Weight: 145 lb (65.8 kg)  PainSc: 0-No pain   Body mass index is 22.71 kg/m.  Physical Exam Constitutional:      Appearance: Normal appearance.  HENT:     Head: Normocephalic.  Eyes:     Extraocular Movements: Extraocular movements intact.     Pupils: Pupils are equal, round, and reactive to light.  Cardiovascular:     Rate and Rhythm: Normal rate and regular rhythm.     Heart sounds: Normal heart sounds.  Pulmonary:     Effort: Pulmonary effort is normal.     Breath sounds: Normal breath sounds.  Abdominal:     General: Bowel sounds are normal.  Musculoskeletal:        General: Normal range of motion.     Cervical back: Normal range of motion.  Skin:    General: Skin is dry.  Neurological:     Mental Status: He is alert and oriented to person, place, and time.  Psychiatric:        Mood and Affect: Mood normal.        Behavior: Behavior normal.        Thought Content: Thought content normal.        Judgment: Judgment normal.      LABS: Lab Results  Component Value Date   HIV1RNAQUANT 31 (H) 12/31/2019   HIV1RNAQUANT 61 (H) 12/02/2019   HIV1RNAQUANT 99,300 (H) 10/03/2019    Lab Results  Component Value Date   CREATININE 0.92 10/03/2019    Lab Results  Component Value Date   ALT 29 10/03/2019   AST 44 (H) 10/03/2019   BILITOT 0.7 10/03/2019    Lab Results  Component Value Date   WBC 6.1 10/03/2019   HGB 14.9 10/03/2019   HCT 45.3 10/03/2019   MCV 85.3 10/03/2019   PLT 246 10/03/2019    No results found for: RPR      Assessment & Plan:   Problem List Items Addressed This Visit      Other   HIV (human immunodeficiency virus infection) (HCC) (Chronic)    Patient reports doing well on Biktarvy with no side effects. Last HIV viral load on 12/31/19 was 31 and CD4 count on 2/15/21was 357. Continue taking  Biktarvy 1 tablet daily. Patient declined needing condoms today and is currently not sexually active. Return to clinic in 3 months or sooner if needed with labs the same day.       Healthcare maintenance    Patient is due for Pneumovax 23 vaccine but will delay due to receiving 1st dose of Moderna COVID vaccine on  01/17/20. Will discuss vaccines at 3 month follow up visit.            Luvenia Starch FNP Student [employer]

## 2020-01-27 NOTE — Assessment & Plan Note (Signed)
Patient is due for Pneumovax 23 vaccine but will delay due to receiving 1st dose of Moderna COVID vaccine on 01/17/20. Will discuss vaccines at 3 month follow up visit.

## 2020-01-27 NOTE — Assessment & Plan Note (Signed)
Patient reports doing well on Biktarvy with no side effects. Last HIV viral load on 12/31/19 was 31 and CD4 count on 2/15/21was 357. Continue taking Biktarvy 1 tablet daily. Patient declined needing condoms today and is currently not sexually active. Return to clinic in 3 months or sooner if needed with labs the same day.

## 2020-01-27 NOTE — Patient Instructions (Signed)
Very nice to see you today!  Please continue your Biktarvy every day as you are. It is working Agricultural consultant for you.   Keep [place] informed if you need some support for emotional/mental needs.   Would like to see you back in 3 months - we will update vaccines at that visits and plan labs same day.

## 2020-05-11 ENCOUNTER — Ambulatory Visit: Payer: BC Managed Care – PPO | Admitting: Infectious Diseases

## 2020-06-30 ENCOUNTER — Ambulatory Visit (INDEPENDENT_AMBULATORY_CARE_PROVIDER_SITE_OTHER): Payer: BC Managed Care – PPO | Admitting: Family

## 2020-06-30 ENCOUNTER — Encounter: Payer: Self-pay | Admitting: Family

## 2020-06-30 ENCOUNTER — Other Ambulatory Visit: Payer: Self-pay

## 2020-06-30 VITALS — BP 129/82 | HR 51 | Temp 98.5°F | Wt 141.0 lb

## 2020-06-30 DIAGNOSIS — Z21 Asymptomatic human immunodeficiency virus [HIV] infection status: Secondary | ICD-10-CM

## 2020-06-30 DIAGNOSIS — Z113 Encounter for screening for infections with a predominantly sexual mode of transmission: Secondary | ICD-10-CM

## 2020-06-30 DIAGNOSIS — Z0000 Encounter for general adult medical examination without abnormal findings: Secondary | ICD-10-CM | POA: Diagnosis not present

## 2020-06-30 DIAGNOSIS — Z79899 Other long term (current) drug therapy: Secondary | ICD-10-CM | POA: Diagnosis not present

## 2020-06-30 MED ORDER — BIKTARVY 50-200-25 MG PO TABS: 1.0000 | ORAL_TABLET | Freq: Every day | ORAL | 3 refills | Status: DC

## 2020-06-30 NOTE — Assessment & Plan Note (Signed)
·   Covid vaccination up-to-date per recommendations.  Declines influenza vaccination.  Discussed importance of safe sexual practice to reduce risk of STI.  Condoms provided.  Rescheduling routine dental care independently.

## 2020-06-30 NOTE — Patient Instructions (Signed)
Nice to see you.  We will check your blood work today.   Refills will be sent to the pharmacy.   Plan for follow up in 4 months or sooner if needed with lab work on same day.  Check on Cabenuva.   Have a great day and stay safe!

## 2020-06-30 NOTE — Progress Notes (Signed)
Subjective:    Patient ID: Leonard Pham, male    DOB: Feb 16, 1996, 24 y.o.   MRN: [id number]  Chief Complaint  Patient presents with   Follow-up    B20     HPI:  Leonard Pham is a 24 y.o. male with HIV disease with risk factors for acquiring HIV including MSM who was last seen in the office on 01/27/2020 with good adherence and tolerance to his ART regimen of Biktarvy and consideration for injectable medications.  Lab work at the time showed a viral load of 31 with CD4 count of 357.  Subsequently missed his last office visit.  Here today for routine follow-up.  Mr. Leonard Pham continues to take his Leonard Pham daily as prescribed with no adverse side effects or missed doses since his last office visit.  Overall feeling well today with no new concerns/complaints. Denies fevers, chills, night sweats, headaches, changes in vision, neck pain/stiffness, nausea, diarrhea, vomiting, lesions or rashes.  Mr. Leonard Pham has no problems obtaining his medication from the pharmacy and has new insurance and will meet with our financial counselors.  Recently experienced feelings of being down/depressed secondary to his roommate moving out and having to cover his entire rent by himself and has since improved his situation.  No suicidal ideations.  Uses marijuana 2 times per week on average with no tobacco use and occasional alcohol consumption.  Covid vaccination up-to-date per recommendations with [facility].  Declines flu shot and condoms provided.  Routine dental care scheduled.   No Known Allergies    Outpatient Medications Prior to Visit  Medication Sig Dispense Refill   bictegravir-emtricitabine-tenofovir AF (BIKTARVY) 50-200-25 MG TABS tablet Take 1 tablet by mouth daily. 30 tablet 11   No facility-administered medications prior to visit.     Past Medical History:  Diagnosis Date   Asthma    had asthma as young child but no symptoms or treatment for 'years'   HIV (human  immunodeficiency virus infection) (HCC) 10/29/2019   Dx 09/2019     History reviewed. No pertinent surgical history.     Review of Systems  Constitutional: Negative for appetite change, chills, fatigue, fever and unexpected weight change.  Eyes: Negative for visual disturbance.  Respiratory: Negative for cough, chest tightness, shortness of breath and wheezing.   Cardiovascular: Negative for chest pain and leg swelling.  Gastrointestinal: Negative for abdominal pain, constipation, diarrhea, nausea and vomiting.  Genitourinary: Negative for dysuria, flank pain, frequency, genital sores, hematuria and urgency.  Skin: Negative for rash.  Allergic/Immunologic: Negative for immunocompromised state.  Neurological: Negative for dizziness and headaches.      Objective:    BP 129/82    Pulse (!) 51    Temp 98.5 F (36.9 C) (Oral)    Wt 141 lb (64 kg)    BMI 22.08 kg/m  Nursing note and vital signs reviewed.  Physical Exam Constitutional:      General: He is not in acute distress.    Appearance: He is well-developed.  Eyes:     Conjunctiva/sclera: Conjunctivae normal.  Cardiovascular:     Rate and Rhythm: Normal rate and regular rhythm.     Heart sounds: Normal heart sounds. No murmur heard.  No friction rub. No gallop.   Pulmonary:     Effort: Pulmonary effort is normal. No respiratory distress.     Breath sounds: Normal breath sounds. No wheezing or rales.  Chest:     Chest wall: No tenderness.  Abdominal:  General: Bowel sounds are normal.     Palpations: Abdomen is soft.     Tenderness: There is no abdominal tenderness.  Musculoskeletal:     Cervical back: Neck supple.  Lymphadenopathy:     Cervical: No cervical adenopathy.  Skin:    General: Skin is warm and dry.     Findings: No rash.  Neurological:     Mental Status: He is alert and oriented to person, place, and time.  Psychiatric:        Behavior: Behavior normal.        Thought Content: Thought content  normal.        Judgment: Judgment normal.      Depression screen [facility] 2/9 06/30/2020 10/29/2019 05/13/2015 05/13/2015  Decreased Interest 1 0 0 0  Down, Depressed, Hopeless 1 0 0 0  PHQ - 2 Score 2 0 0 0  Altered sleeping 0 - - -  Tired, decreased energy 0 - - -  Change in appetite 0 - - -  Feeling bad or failure about yourself  1 - - -  Trouble concentrating 0 - - -  Moving slowly or fidgety/restless 0 - - -  Suicidal thoughts 0 - - -  PHQ-9 Score 3 - - -       Assessment & Plan:    Patient Active Problem List   Diagnosis Date Noted   HIV (human immunodeficiency virus infection) (HCC) 10/29/2019   Healthcare maintenance 10/29/2019     Problem List Items Addressed This Visit      Other   HIV (human immunodeficiency virus infection) (HCC) (Chronic)    [name] appears to be doing well with good adherence and tolerance to his ART regimen of Biktarvy.  No signs/symptoms of opportunistic infection or progressive HIV disease.  We reviewed lab work and discussed plan of care.  He will meet with financial counselors once new insurance is activated.  Check blood work today.  Continue current dose of Biktarvy.  Briefly discussed injectable medications and he will research.  Plan for follow-up in 4 months or sooner if needed with lab work on the same day.      Relevant Medications   bictegravir-emtricitabine-tenofovir AF (BIKTARVY) 50-200-25 MG TABS tablet   Other Relevant Orders   COMPLETE METABOLIC PANEL WITH GFR   HIV-1 RNA quant-no reflex-bld   T-helper cell (CD4)- ([facility] clinic only)   Healthcare maintenance     Covid vaccination up-to-date per recommendations.  Declines influenza vaccination.  Discussed importance of safe sexual practice to reduce risk of STI.  Condoms provided.  Rescheduling routine dental care independently.       Other Visit Diagnoses    Screening for STDs (sexually transmitted diseases)    -  Primary   Relevant Orders   RPR   Pharmacologic  therapy       Relevant Orders   Lipid panel       I am having Leonard Pham "[name]" maintain his Biktarvy.   Meds ordered this encounter  Medications   bictegravir-emtricitabine-tenofovir AF (BIKTARVY) 50-200-25 MG TABS tablet    Sig: Take 1 tablet by mouth daily.    Dispense:  30 tablet    Refill:  3    Order Specific Question:   Supervising Provider    Answer:   Judyann Munson [[id number]]     Follow-up: Return in about 4 months (around 10/30/2020), or if symptoms worsen or fail to improve.   Marcos Eke, MSN, FNP-C Nurse Practitioner [facility] Main number: [phone]

## 2020-06-30 NOTE — Assessment & Plan Note (Signed)
Leonard Pham appears to be doing well with good adherence and tolerance to his ART regimen of Biktarvy.  No signs/symptoms of opportunistic infection or progressive HIV disease.  We reviewed lab work and discussed plan of care.  He will meet with financial counselors once new insurance is activated.  Check blood work today.  Continue current dose of Biktarvy.  Briefly discussed injectable medications and he will research.  Plan for follow-up in 4 months or sooner if needed with lab work on the same day.

## 2020-07-01 LAB — T-HELPER CELL (CD4) - (RCID CLINIC ONLY)
CD4 % Helper T Cell: 19 % — ABNORMAL LOW (ref 33–65)
CD4 T Cell Abs: 582 /uL (ref 400–1790)

## 2020-07-05 LAB — COMPLETE METABOLIC PANEL WITH GFR
AG Ratio: 1.7 (calc) (ref 1.0–2.5)
ALT: 13 U/L (ref 9–46)
AST: 18 U/L (ref 10–40)
Albumin: 4.4 g/dL (ref 3.6–5.1)
Alkaline phosphatase (APISO): 45 U/L (ref 36–130)
BUN: 9 mg/dL (ref 7–25)
CO2: 26 mmol/L (ref 20–32)
Calcium: 9.7 mg/dL (ref 8.6–10.3)
Chloride: 104 mmol/L (ref 98–110)
Creat: 1 mg/dL (ref 0.60–1.35)
GFR, Est African American: 122 mL/min/{1.73_m2} (ref >=60)
GFR, Est Non African American: 106 mL/min/{1.73_m2} (ref >=60)
Globulin: 2.6 g/dL (calc) (ref 1.9–3.7)
Glucose, Bld: 78 mg/dL (ref 65–99)
Potassium: 4.2 mmol/L (ref 3.5–5.3)
Sodium: 138 mmol/L (ref 135–146)
Total Bilirubin: 2.6 mg/dL — ABNORMAL HIGH (ref 0.2–1.2)
Total Protein: 7 g/dL (ref 6.1–8.1)

## 2020-07-05 LAB — LIPID PANEL
Cholesterol: 128 mg/dL (ref <200)
HDL: 48 mg/dL (ref >=40)
LDL Cholesterol (Calc): 66 mg/dL (calc)
Non-HDL Cholesterol (Calc): 80 mg/dL (calc) (ref <130)
Total CHOL/HDL Ratio: 2.7 (calc) (ref <5.0)
Triglycerides: 62 mg/dL (ref <150)

## 2020-07-05 LAB — HIV-1 RNA QUANT-NO REFLEX-BLD
HIV 1 RNA Quant: <20 Copies/mL — ABNORMAL HIGH
HIV-1 RNA Quant, Log: <1.3 Log cps/mL — ABNORMAL HIGH

## 2020-07-05 LAB — RPR TITER: RPR Titer: 1:16 {titer} — ABNORMAL HIGH

## 2020-07-05 LAB — FLUORESCENT TREPONEMAL AB(FTA)-IGG-BLD: Fluorescent Treponemal ABS: REACTIVE — AB

## 2020-07-05 LAB — RPR: RPR Ser Ql: REACTIVE — AB

## 2020-07-06 ENCOUNTER — Encounter: Payer: Self-pay | Admitting: Family

## 2020-10-19 ENCOUNTER — Ambulatory Visit: Payer: Managed Care, Other (non HMO) | Admitting: Family

## 2020-11-30 ENCOUNTER — Other Ambulatory Visit: Payer: Self-pay | Admitting: Family

## 2021-01-08 ENCOUNTER — Other Ambulatory Visit: Payer: Self-pay | Admitting: Infectious Diseases

## 2021-01-08 NOTE — Telephone Encounter (Signed)
Pending on upcoming appointment.

## 2021-01-14 ENCOUNTER — Other Ambulatory Visit: Payer: Self-pay

## 2021-01-14 ENCOUNTER — Ambulatory Visit (INDEPENDENT_AMBULATORY_CARE_PROVIDER_SITE_OTHER): Payer: Managed Care, Other (non HMO) | Admitting: Family

## 2021-01-14 ENCOUNTER — Encounter: Payer: Self-pay | Admitting: Family

## 2021-01-14 VITALS — BP 119/79 | HR 58

## 2021-01-14 DIAGNOSIS — Z0000 Encounter for general adult medical examination without abnormal findings: Secondary | ICD-10-CM | POA: Diagnosis not present

## 2021-01-14 DIAGNOSIS — Z113 Encounter for screening for infections with a predominantly sexual mode of transmission: Secondary | ICD-10-CM | POA: Diagnosis not present

## 2021-01-14 DIAGNOSIS — Z21 Asymptomatic human immunodeficiency virus [HIV] infection status: Secondary | ICD-10-CM

## 2021-01-14 MED ORDER — BIKTARVY 50-200-25 MG PO TABS: 1.0000 | ORAL_TABLET | Freq: Every day | ORAL | 4 refills | Status: DC

## 2021-01-14 NOTE — Progress Notes (Signed)
Brief Narrative   Patient ID: Leonard Pham, male    DOB: Apr 25, 1996, 25 y.o.   MRN: [id number]  Leonard Pham is a 25 y/o AA male diagnosed with HIV in December 2020. CD4 nair of 277 with initial viral load of 99.300. Risk factor for HIV is MSM. No history of opportunistic infection. Genotype with no significant mutations. No previous regimens prior to [employer].    Subjective:    Chief Complaint  Patient presents with  . Follow-up    Declined condoms; no questions/concerns;     HPI:  Leonard Pham is a 25 y.o. male with HIV disease last seen on 06/30/2020 with well-controlled virus and good adherence and tolerance to his ART regimen of Biktarvy.  Viral load at the time was undetectable with CD4 count of 582.  RPR titer down to 1: 16 from 1: 64.  Here today for routine follow-up.  Leonard Pham continues to take his Biktarvy daily as prescribed having missed 2 doses over the weekend secondary to running out of medication.  Has questions regarding injectable medications.  Overall feeling well today. Denies fevers, chills, night sweats, headaches, changes in vision, neck pain/stiffness, nausea, diarrhea, vomiting, lesions or rashes.  Leonard Pham has no problems obtaining medication from the pharmacy and remains covered through [place].  Denies feelings of being down, depressed, or hopeless recently.  Continues to drink alcohol socially smokes marijuana occasionally and no tobacco use.  Condoms declined.  Routine dental care up-to-date per recommendations.   No Known Allergies    Outpatient Medications Prior to Visit  Medication Sig Dispense Refill  . BIKTARVY 50-200-25 MG TABS tablet TAKE 1 TABLET BY MOUTH DAILY 30 tablet 0   No facility-administered medications prior to visit.     Past Medical History:  Diagnosis Date  . Asthma    had asthma as young child but no symptoms or treatment for 'years'  . HIV (human immunodeficiency virus infection) (HCC) 10/29/2019    Dx 09/2019     History reviewed. No pertinent surgical history.     Review of Systems  Constitutional: Negative for appetite change, chills, fatigue, fever and unexpected weight change.  Eyes: Negative for visual disturbance.  Respiratory: Negative for cough, chest tightness, shortness of breath and wheezing.   Cardiovascular: Negative for chest pain and leg swelling.  Gastrointestinal: Negative for abdominal pain, constipation, diarrhea, nausea and vomiting.  Genitourinary: Negative for dysuria, flank pain, frequency, genital sores, hematuria and urgency.  Skin: Negative for rash.  Allergic/Immunologic: Negative for immunocompromised state.  Neurological: Negative for dizziness and headaches.      Objective:    BP 119/79   Pulse (!) 58  Nursing note and vital signs reviewed.  Physical Exam Constitutional:      General: He is not in acute distress.    Appearance: He is well-developed.  Eyes:     Conjunctiva/sclera: Conjunctivae normal.  Cardiovascular:     Rate and Rhythm: Normal rate and regular rhythm.     Heart sounds: Normal heart sounds. No murmur heard. No friction rub. No gallop.   Pulmonary:     Effort: Pulmonary effort is normal. No respiratory distress.     Breath sounds: Normal breath sounds. No wheezing or rales.  Chest:     Chest wall: No tenderness.  Abdominal:     General: Bowel sounds are normal.     Palpations: Abdomen is soft.     Tenderness: There is no abdominal tenderness.  Musculoskeletal:  Cervical back: Neck supple.  Lymphadenopathy:     Cervical: No cervical adenopathy.  Skin:    General: Skin is warm and dry.     Findings: No rash.  Neurological:     Mental Status: He is alert and oriented to person, place, and time.  Psychiatric:        Behavior: Behavior normal.        Thought Content: Thought content normal.        Judgment: Judgment normal.      Depression screen [facility] 2/9 01/14/2021 06/30/2020 10/29/2019 05/13/2015 05/13/2015   Decreased Interest 0 1 0 0 0  Down, Depressed, Hopeless 0 1 0 0 0  PHQ - 2 Score 0 2 0 0 0  Altered sleeping - 0 - - -  Tired, decreased energy - 0 - - -  Change in appetite - 0 - - -  Feeling bad or failure about yourself  - 1 - - -  Trouble concentrating - 0 - - -  Moving slowly or fidgety/restless - 0 - - -  Suicidal thoughts - 0 - - -  PHQ-9 Score - 3 - - -       Assessment & Plan:    Patient Active Problem List   Diagnosis Date Noted  . Asymptomatic HIV infection (HCC) 10/29/2019  . Healthcare maintenance 10/29/2019     Problem List Items Addressed This Visit      Other   Asymptomatic HIV infection (HCC) - Primary    Mr. Leonard Pham continues to have well-controlled HIV with good adherence and tolerance to his ART regimen of Biktarvy.  No signs/symptoms of opportunistic infection or progressive HIV.  Reviewed previous lab work and discussed plan of care.  Continue current dose of Biktarvy.  Check blood work today.  Plan for follow-up in 4 months or sooner if needed with lab work on the same day.      Relevant Medications   bictegravir-emtricitabine-tenofovir AF (BIKTARVY) 50-200-25 MG TABS tablet   Other Relevant Orders   COMPLETE METABOLIC PANEL WITH GFR   HIV-1 RNA quant-no reflex-bld   T-helper cell (CD4)- ([facility] clinic only)   Healthcare maintenance     Discussed importance of safe sexual practice to reduce risk of STI.  Condoms declined.  Routine dental care up-to-date per recommendations.  Immunizations up-to-date per recommendations.       Other Visit Diagnoses    Screening for STDs (sexually transmitted diseases)       Relevant Orders   RPR       I have changed Leonard Pham "Leonard Pham"'s Biktarvy.   Meds ordered this encounter  Medications  . bictegravir-emtricitabine-tenofovir AF (BIKTARVY) 50-200-25 MG TABS tablet    Sig: Take 1 tablet by mouth daily.    Dispense:  30 tablet    Refill:  4    Order Specific Question:   Supervising Provider     Answer:   Judyann Munson [[id number]]     Follow-up: Return in about 4 months (around 05/16/2021), or if symptoms worsen or fail to improve.   Marcos Eke, MSN, FNP-C Nurse Practitioner [facility] Main number: [phone]

## 2021-01-14 NOTE — Assessment & Plan Note (Signed)
Leonard Pham continues to have well-controlled HIV with good adherence and tolerance to his ART regimen of Biktarvy.  No signs/symptoms of opportunistic infection or progressive HIV.  Reviewed previous lab work and discussed plan of care.  Continue current dose of Biktarvy.  Check blood work today.  Plan for follow-up in 4 months or sooner if needed with lab work on the same day.

## 2021-01-14 NOTE — Progress Notes (Signed)
   Subjective:    Patient ID: Leonard Pham, male    DOB: 10-Feb-1996, 24 y.o.   MRN: [id number]  Chief Complaint  Patient presents with  . Follow-up    Declined condoms; no questions/concerns;     HPI:  Leonard Pham is a 25 y.o. male   Ran out of medication of Friday - missed 2 doses.  Up to date.    No Known Allergies    Outpatient Medications Prior to Visit  Medication Sig Dispense Refill  . BIKTARVY 50-200-25 MG TABS tablet TAKE 1 TABLET BY MOUTH DAILY 30 tablet 0   No facility-administered medications prior to visit.     Past Medical History:  Diagnosis Date  . Asthma    had asthma as young child but no symptoms or treatment for 'years'  . HIV (human immunodeficiency virus infection) (HCC) 10/29/2019   Dx 09/2019     No past surgical history on file.     Review of Systems    Objective:    BP 119/79   Pulse (!) 58  Nursing note and vital signs reviewed.  Physical Exam   Depression screen [facility] 2/9 01/14/2021 06/30/2020 10/29/2019 05/13/2015 05/13/2015  Decreased Interest 0 1 0 0 0  Down, Depressed, Hopeless 0 1 0 0 0  PHQ - 2 Score 0 2 0 0 0  Altered sleeping - 0 - - -  Tired, decreased energy - 0 - - -  Change in appetite - 0 - - -  Feeling bad or failure about yourself  - 1 - - -  Trouble concentrating - 0 - - -  Moving slowly or fidgety/restless - 0 - - -  Suicidal thoughts - 0 - - -  PHQ-9 Score - 3 - - -       Assessment & Plan:    Patient Active Problem List   Diagnosis Date Noted  . HIV (human immunodeficiency virus infection) (HCC) 10/29/2019  . Healthcare maintenance 10/29/2019     Problem List Items Addressed This Visit   None   Visit Diagnoses    Asymptomatic HIV infection (HCC)    -  Primary   Relevant Orders   COMPLETE METABOLIC PANEL WITH GFR   HIV-1 RNA quant-no reflex-bld   T-helper cell (CD4)- ([facility] clinic only)   Screening for STDs (sexually transmitted diseases)       Relevant Orders    RPR       I am having Leonard Pham "Leonard Pham" maintain his Biktarvy.   No orders of the defined types were placed in this encounter.    Follow-up: No follow-ups on file.   Marcos Eke, MSN, FNP-C Nurse Practitioner [facility] Main number: [phone]

## 2021-01-14 NOTE — Assessment & Plan Note (Addendum)
   Discussed importance of safe sexual practice to reduce risk of STI.  Condoms declined.  Routine dental care up-to-date per recommendations.  Immunizations up-to-date per recommendations.

## 2021-01-14 NOTE — Patient Instructions (Signed)
Nice to see you.  Continue take your [place] daily as prescribed.  Refills of been sent to the pharmacy.  Plan for follow-up in 4 months or sooner if needed with lab work 1 to 2 weeks prior to appointment or on same day.  Good luck in your last couple months of school!  Have a great day and stay safe!

## 2021-01-15 LAB — T-HELPER CELL (CD4) - (RCID CLINIC ONLY)
CD4 % Helper T Cell: 22 % — ABNORMAL LOW (ref 33–65)
CD4 T Cell Abs: 685 /uL (ref 400–1790)

## 2021-01-18 LAB — COMPLETE METABOLIC PANEL WITH GFR
AG Ratio: 1.8 (calc) (ref 1.0–2.5)
ALT: 8 U/L — ABNORMAL LOW (ref 9–46)
AST: 15 U/L (ref 10–40)
Albumin: 5 g/dL (ref 3.6–5.1)
Alkaline phosphatase (APISO): 50 U/L (ref 36–130)
BUN: 9 mg/dL (ref 7–25)
CO2: 26 mmol/L (ref 20–32)
Calcium: 10 mg/dL (ref 8.6–10.3)
Chloride: 104 mmol/L (ref 98–110)
Creat: 1.04 mg/dL (ref 0.60–1.35)
GFR, Est African American: 117 mL/min/{1.73_m2} (ref >=60)
GFR, Est Non African American: 101 mL/min/{1.73_m2} (ref >=60)
Globulin: 2.8 g/dL (calc) (ref 1.9–3.7)
Glucose, Bld: 85 mg/dL (ref 65–99)
Potassium: 4.2 mmol/L (ref 3.5–5.3)
Sodium: 140 mmol/L (ref 135–146)
Total Bilirubin: 1.7 mg/dL — ABNORMAL HIGH (ref 0.2–1.2)
Total Protein: 7.8 g/dL (ref 6.1–8.1)

## 2021-01-18 LAB — HIV-1 RNA QUANT-NO REFLEX-BLD
HIV 1 RNA Quant: NOT DETECTED Copies/mL
HIV-1 RNA Quant, Log: NOT DETECTED Log cps/mL

## 2021-01-18 LAB — RPR TITER: RPR Titer: 1:16 {titer} — ABNORMAL HIGH

## 2021-01-18 LAB — FLUORESCENT TREPONEMAL AB(FTA)-IGG-BLD: Fluorescent Treponemal ABS: REACTIVE — AB

## 2021-01-18 LAB — RPR: RPR Ser Ql: REACTIVE — AB

## 2021-02-08 ENCOUNTER — Other Ambulatory Visit: Payer: Self-pay | Admitting: Family

## 2021-04-09 ENCOUNTER — Other Ambulatory Visit: Payer: Self-pay

## 2021-04-09 DIAGNOSIS — Z113 Encounter for screening for infections with a predominantly sexual mode of transmission: Secondary | ICD-10-CM

## 2021-04-09 DIAGNOSIS — B20 Human immunodeficiency virus [HIV] disease: Secondary | ICD-10-CM

## 2021-04-09 DIAGNOSIS — Z79899 Other long term (current) drug therapy: Secondary | ICD-10-CM

## 2021-04-13 ENCOUNTER — Other Ambulatory Visit: Payer: Managed Care, Other (non HMO)

## 2021-04-13 ENCOUNTER — Other Ambulatory Visit: Payer: Self-pay

## 2021-04-13 ENCOUNTER — Other Ambulatory Visit (HOSPITAL_COMMUNITY)
Admission: RE | Admit: 2021-04-13 | Discharge: 2021-04-13 | Disposition: A | Discharge status: Home / Self Care | Payer: Managed Care, Other (non HMO) | Source: Ambulatory Visit | Attending: Family | Admitting: Family

## 2021-04-13 DIAGNOSIS — Z113 Encounter for screening for infections with a predominantly sexual mode of transmission: Secondary | ICD-10-CM

## 2021-04-13 DIAGNOSIS — B20 Human immunodeficiency virus [HIV] disease: Secondary | ICD-10-CM

## 2021-04-13 DIAGNOSIS — Z79899 Other long term (current) drug therapy: Secondary | ICD-10-CM

## 2021-04-14 LAB — T-HELPER CELL (CD4) - (RCID CLINIC ONLY)
CD4 % Helper T Cell: 29 % — ABNORMAL LOW (ref 33–65)
CD4 T Cell Abs: 450 /uL (ref 400–1790)

## 2021-04-14 LAB — URINE CYTOLOGY ANCILLARY ONLY
Chlamydia: NEGATIVE
Comment: NEGATIVE
Comment: NORMAL
Neisseria Gonorrhea: NEGATIVE

## 2021-04-16 LAB — COMPLETE METABOLIC PANEL WITH GFR
AG Ratio: 1.7 (calc) (ref 1.0–2.5)
ALT: 14 U/L (ref 9–46)
AST: 22 U/L (ref 10–40)
Albumin: 5.1 g/dL (ref 3.6–5.1)
Alkaline phosphatase (APISO): 52 U/L (ref 36–130)
BUN: 13 mg/dL (ref 7–25)
CO2: 26 mmol/L (ref 20–32)
Calcium: 10.4 mg/dL — ABNORMAL HIGH (ref 8.6–10.3)
Chloride: 102 mmol/L (ref 98–110)
Creat: 1.08 mg/dL (ref 0.60–1.35)
GFR, Est African American: 111 mL/min/{1.73_m2} (ref >=60)
GFR, Est Non African American: 96 mL/min/{1.73_m2} (ref >=60)
Globulin: 3 g/dL (calc) (ref 1.9–3.7)
Glucose, Bld: 87 mg/dL (ref 65–99)
Potassium: 4.4 mmol/L (ref 3.5–5.3)
Sodium: 138 mmol/L (ref 135–146)
Total Bilirubin: 2.2 mg/dL — ABNORMAL HIGH (ref 0.2–1.2)
Total Protein: 8.1 g/dL (ref 6.1–8.1)

## 2021-04-16 LAB — CBC WITH DIFFERENTIAL/PLATELET
Absolute Monocytes: 251 cells/uL (ref 200–950)
Basophils Absolute: 40 cells/uL (ref 0–200)
Basophils Relative: 0.6 %
Eosinophils Absolute: 20 cells/uL (ref 15–500)
Eosinophils Relative: 0.3 %
HCT: 48.3 % (ref 38.5–50.0)
Hemoglobin: 15.9 g/dL (ref 13.2–17.1)
Lymphs Abs: 1637 cells/uL (ref 850–3900)
MCH: 29.1 pg (ref 27.0–33.0)
MCHC: 32.9 g/dL (ref 32.0–36.0)
MCV: 88.5 fL (ref 80.0–100.0)
MPV: 10.1 fL (ref 7.5–12.5)
Monocytes Relative: 3.8 %
Neutro Abs: 4653 cells/uL (ref 1500–7800)
Neutrophils Relative %: 70.5 %
Platelets: 249 10*3/uL (ref 140–400)
RBC: 5.46 10*6/uL (ref 4.20–5.80)
RDW: 13.6 % (ref 11.0–15.0)
Total Lymphocyte: 24.8 %
WBC: 6.6 10*3/uL (ref 3.8–10.8)

## 2021-04-16 LAB — LIPID PANEL
Cholesterol: 166 mg/dL (ref <200)
HDL: 52 mg/dL (ref >=40)
LDL Cholesterol (Calc): 100 mg/dL (calc) — ABNORMAL HIGH
Non-HDL Cholesterol (Calc): 114 mg/dL (calc) (ref <130)
Total CHOL/HDL Ratio: 3.2 (calc) (ref <5.0)
Triglycerides: 48 mg/dL (ref <150)

## 2021-04-16 LAB — FLUORESCENT TREPONEMAL AB(FTA)-IGG-BLD: Fluorescent Treponemal ABS: REACTIVE — AB

## 2021-04-16 LAB — RPR: RPR Ser Ql: REACTIVE — AB

## 2021-04-16 LAB — RPR TITER: RPR Titer: 1:16 {titer} — ABNORMAL HIGH

## 2021-04-16 LAB — HIV-1 RNA QUANT-NO REFLEX-BLD
HIV 1 RNA Quant: <20 Copies/mL — ABNORMAL HIGH
HIV-1 RNA Quant, Log: <1.3 Log cps/mL — ABNORMAL HIGH

## 2021-04-27 ENCOUNTER — Ambulatory Visit (INDEPENDENT_AMBULATORY_CARE_PROVIDER_SITE_OTHER): Payer: Managed Care, Other (non HMO) | Admitting: Family

## 2021-04-27 ENCOUNTER — Encounter: Payer: Self-pay | Admitting: Family

## 2021-04-27 ENCOUNTER — Other Ambulatory Visit: Payer: Self-pay

## 2021-04-27 VITALS — BP 121/70 | HR 46 | Temp 99.1°F | Wt 145.0 lb

## 2021-04-27 DIAGNOSIS — Z0000 Encounter for general adult medical examination without abnormal findings: Secondary | ICD-10-CM | POA: Diagnosis not present

## 2021-04-27 DIAGNOSIS — Z21 Asymptomatic human immunodeficiency virus [HIV] infection status: Secondary | ICD-10-CM | POA: Diagnosis not present

## 2021-04-27 MED ORDER — BIKTARVY 50-200-25 MG PO TABS: 1.0000 | ORAL_TABLET | Freq: Every day | ORAL | 5 refills | Status: DC

## 2021-04-27 NOTE — Assessment & Plan Note (Signed)
Leonard Pham continues to take his Biktarvy as prescribed with no adverse side effects and occasional missed dose.  No signs/symptoms of opportunistic infection or progressive HIV.  We reviewed lab work and discussed plan of care.  Continue current dose of Biktarvy.  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  We will need to fill and medical release form once new providers found.  Certainly welcome back at any time.  If he remains in the area we will plan for follow-up in 4 months or sooner if needed.

## 2021-04-27 NOTE — Patient Instructions (Signed)
Nice to see you.  Continue to take your medications as prescribed.   Refills have been sent to the pharmacy.  Plan for follow up in 4 months or sooner if needed if not established in the [place] area.  Have a great day and stay safe!

## 2021-04-27 NOTE — Assessment & Plan Note (Signed)
   Discussed importance of safe sexual practice to reduce risk of STI.  Condoms declined.  Routine dental care is up-to-date per recommendations.  Declines vaccinations.

## 2021-04-27 NOTE — Progress Notes (Signed)
Brief Narrative   Patient ID: Leonard Pham, male    DOB: October 26, 1995, 25 y.o.   MRN: [id number]  Leonard Pham is a 25 y/o AA male diagnosed with HIV in December 2020. CD4 nair of 277 with initial viral load of 99.300. Risk factor for HIV is MSM. No history of opportunistic infection. Genotype with no significant mutations. No previous regimens prior to [employer].  Subjective:    Chief Complaint  Patient presents with   Follow-up    HPI:  Leonard Pham is a 25 y.o. male with HIV disease last seen on 01/04/2021 with well-controlled virus and good adherence and tolerance to his ART regimen of Biktarvy.  Viral load at the time was undetectable and CD4 count 685.  Most recent blood work completed on 04/13/2021 with viral load that remains undetectable and CD4 count of 450.  Urine was negative for gonorrhea and chlamydia.  Syphilis titer remains at 1: 16 down from treatment level 1: 64.  Here today for routine follow-up.  Leonard Pham continues to take his Biktarvy as prescribed with occasional missed dose and that he is doing more traveling.  No adverse side effects.  Overall feeling well today with no new concerns/complaints.  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 Denies fevers, chills, night sweats, headaches, changes in vision, neck pain/stiffness, nausea, diarrhea, vomiting, lesions or rashes.  Leonard Pham has the occasional slowdown obtaining medication from the pharmacy but generally gets it on time.  Denies feelings of being down, depressed, or hopeless recently.  No recreational illicit drug use or tobacco use.  Drinks alcohol on occasion.  Declines condoms.  Routine dental care is up-to-date.  Healthcare maintenance due includes Pneumovax.    No Known Allergies    Outpatient Medications Prior to Visit  Medication Sig Dispense Refill   bictegravir-emtricitabine-tenofovir AF (BIKTARVY) 50-200-25 MG TABS  tablet Take 1 tablet by mouth daily. 30 tablet 4   No facility-administered medications prior to visit.     Past Medical History:  Diagnosis Date   Asthma    had asthma as young child but no symptoms or treatment for 'years'   HIV (human immunodeficiency virus infection) (HCC) 10/29/2019   Dx 09/2019     History reviewed. No pertinent surgical history.     Review of Systems  Constitutional:  Negative for appetite change, chills, fatigue, fever and unexpected weight change.  Eyes:  Negative for visual disturbance.  Respiratory:  Negative for cough, chest tightness, shortness of breath and wheezing.   Cardiovascular:  Negative for chest pain and leg swelling.  Gastrointestinal:  Negative for abdominal pain, constipation, diarrhea, nausea and vomiting.  Genitourinary:  Negative for dysuria, flank pain, frequency, genital sores, hematuria and urgency.  Skin:  Negative for rash.  Allergic/Immunologic: Negative for immunocompromised state.  Neurological:  Negative for dizziness and headaches.     Objective:    BP 121/70   Pulse (!) 46   Temp 99.1 F (37.3 C) (Oral)   Wt 145 lb (65.8 kg)   SpO2 94%   BMI 22.71 kg/m  Nursing note and vital signs reviewed.  Physical Exam Constitutional:      General: He is not in acute distress.    Appearance: He is well-developed.  Eyes:     Conjunctiva/sclera: Conjunctivae normal.  Cardiovascular:     Rate and Rhythm: Normal rate and regular rhythm.     Heart sounds: Normal  heart sounds. No murmur heard.   No friction rub. No gallop.  Pulmonary:     Effort: Pulmonary effort is normal. No respiratory distress.     Breath sounds: Normal breath sounds. No wheezing or rales.  Chest:     Chest wall: No tenderness.  Abdominal:     General: Bowel sounds are normal.     Palpations: Abdomen is soft.     Tenderness: There is no abdominal tenderness.  Musculoskeletal:     Cervical back: Neck supple.  Lymphadenopathy:     Cervical: No  cervical adenopathy.  Skin:    General: Skin is warm and dry.     Findings: No rash.  Neurological:     Mental Status: He is alert and oriented to person, place, and time.  Psychiatric:        Behavior: Behavior normal.        Thought Content: Thought content normal.        Judgment: Judgment normal.     Depression screen [facility] 2/9 01/14/2021 06/30/2020 10/29/2019 05/13/2015 05/13/2015  Decreased Interest 0 1 0 0 0  Down, Depressed, Hopeless 0 1 0 0 0  PHQ - 2 Score 0 2 0 0 0  Altered sleeping - 0 - - -  Tired, decreased energy - 0 - - -  Change in appetite - 0 - - -  Feeling bad or failure about yourself  - 1 - - -  Trouble concentrating - 0 - - -  Moving slowly or fidgety/restless - 0 - - -  Suicidal thoughts - 0 - - -  PHQ-9 Score - 3 - - -       Assessment & Plan:    Patient Active Problem List   Diagnosis Date Noted   Asymptomatic HIV infection (HCC) 10/29/2019   Healthcare maintenance 10/29/2019     Problem List Items Addressed This Visit       Other   Asymptomatic HIV infection (HCC) - Primary    Leonard Pham continues to take his Biktarvy as prescribed with no adverse side effects and occasional missed dose.  No signs/symptoms of opportunistic infection or progressive HIV.  We reviewed lab work and discussed plan of care.  Continue current dose of Biktarvy.  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  We will need to fill and medical release form once new providers found.  Certainly welcome back at any time.  If he remains in the area we will plan for follow-up in 4 months or sooner if needed.       Relevant Medications   bictegravir-emtricitabine-tenofovir AF (BIKTARVY) 50-200-25 MG TABS tablet   Healthcare maintenance    Discussed importance of safe sexual practice to reduce risk of STI.  Condoms declined. Routine dental care is up-to-date per recommendations. Declines vaccinations.         I am having Leonard D. Nidiffer Pham "[name]" maintain  his Biktarvy.   Meds ordered this encounter  Medications   bictegravir-emtricitabine-tenofovir AF (BIKTARVY) 50-200-25 MG TABS tablet    Sig: Take 1 tablet by mouth daily.    Dispense:  30 tablet    Refill:  5    Order Specific Question:   Supervising Provider    Answer:   Judyann Munson [[id number]]     Follow-up: Return in about 4 months (around 08/28/2021), or if symptoms worsen or fail to improve.   Marcos Eke, MSN, FNP-C Nurse Practitioner [facility] for [facility] Disease [facility]  [facility] Main number: [phone]

## 2021-08-12 ENCOUNTER — Other Ambulatory Visit: Payer: Self-pay | Admitting: Family

## 2021-09-15 ENCOUNTER — Ambulatory Visit: Payer: Managed Care, Other (non HMO) | Admitting: Family

## 2021-09-20 ENCOUNTER — Ambulatory Visit: Payer: Managed Care, Other (non HMO) | Admitting: Family

## 2022-02-09 ENCOUNTER — Other Ambulatory Visit: Payer: Self-pay

## 2022-02-09 ENCOUNTER — Ambulatory Visit (INDEPENDENT_AMBULATORY_CARE_PROVIDER_SITE_OTHER): Payer: Managed Care, Other (non HMO) | Admitting: Family

## 2022-02-09 ENCOUNTER — Encounter: Payer: Self-pay | Admitting: Family

## 2022-02-09 ENCOUNTER — Other Ambulatory Visit (HOSPITAL_COMMUNITY)
Admission: RE | Admit: 2022-02-09 | Discharge: 2022-02-09 | Disposition: A | Discharge status: Home / Self Care | Payer: Managed Care, Other (non HMO) | Source: Ambulatory Visit | Attending: Family | Admitting: Family

## 2022-02-09 VITALS — BP 120/80 | HR 67 | Temp 98.0°F | Resp 16 | Ht 67.0 in | Wt 138.0 lb

## 2022-02-09 DIAGNOSIS — Z79899 Other long term (current) drug therapy: Secondary | ICD-10-CM

## 2022-02-09 DIAGNOSIS — Z0000 Encounter for general adult medical examination without abnormal findings: Secondary | ICD-10-CM | POA: Diagnosis not present

## 2022-02-09 DIAGNOSIS — Z113 Encounter for screening for infections with a predominantly sexual mode of transmission: Secondary | ICD-10-CM

## 2022-02-09 DIAGNOSIS — Z23 Encounter for immunization: Secondary | ICD-10-CM | POA: Diagnosis not present

## 2022-02-09 DIAGNOSIS — Z21 Asymptomatic human immunodeficiency virus [HIV] infection status: Secondary | ICD-10-CM

## 2022-02-09 MED ORDER — BIKTARVY 50-200-25 MG PO TABS: 1.0000 | ORAL_TABLET | Freq: Every day | ORAL | 7 refills | Status: DC

## 2022-02-09 NOTE — Assessment & Plan Note (Signed)
Leonard Pham has well-controlled virus with good adherence and tolerance to Biktarvy.  We reviewed previous lab work and discussed plan of care.  Check blood work today.  Continue current dose of Biktarvy.  Plan for follow-up in 6 months or sooner if needed with lab work on the same day. ?

## 2022-02-09 NOTE — Progress Notes (Signed)
? ? ?Brief Narrative  ? ?Patient ID: Leonard BellmanGwathney [name] Mochizuki Pham, male    DOB: Jun 05, 1996, 26 y.o.   MRN: [id number] ? ?Leonard Pham is a 26 y/o AA male diagnosed with HIV in December 2020. CD4 nair of 277 with initial viral load of 99,000. Risk factor for HIV is MSM. No history of opportunistic infection. Genotype with no significant mutations. [id number] negative. [facility] No previous regimens prior to [employer]. ? ?Subjective:  ?  ?Chief Complaint  ?Patient presents with  ? Follow-up  ?  B20   ? ? ?HPI: ? ?Leonard Pham is a 26 y.o. male with HIV disease last seen on 04/27/2021 with well-controlled virus and good adherence and tolerance to his ART regimen of Biktarvy.  Viral load was undetectable with CD4 count of 450.  RPR titer was stable at 1: 16 down from treatment level of 1: 64.  Kidney function, liver function, electrolytes within normal ranges.  Here today for routine follow-up. ? ?Leonard Pham continues to take his Biktarvy daily as prescribed with no adverse side effects.  Overall feeling well today with no new concerns/complaints. Denies fevers, chills, night sweats, headaches, changes in vision, neck pain/stiffness, nausea, diarrhea, vomiting, lesions or rashes. ? ?Leonard Pham has no problems obtaining medication from the pharmacy remains covered by [employer].  Denies feelings of being down, depressed, or hopeless recently.  Continues to drink alcohol socially and marijuana on occasion with some day vaping.  Condoms offered.  Healthcare maintenance due includes Menveo and Prevnar 20.  Routine dental care up-to-date. ? ? ?No Known Allergies ? ? ? ?Outpatient Medications Prior to Visit  ?Medication Sig Dispense Refill  ? albuterol (VENTOLIN HFA) 108 (90 Base) MCG/ACT inhaler SMARTSIG:2 Puff(s) By Mouth Every 6 Hours PRN    ? BIKTARVY 50-200-25 MG TABS tablet TAKE 1 TABLET BY MOUTH DAILY 30 tablet 5  ? ?No facility-administered medications prior to visit.  ? ? ? ?Past Medical History:   ?Diagnosis Date  ? Asthma   ? had asthma as young child but no symptoms or treatment for 'years'  ? HIV (human immunodeficiency virus infection) (HCC) 10/29/2019  ? Dx 09/2019  ? ? ? ?History reviewed. No pertinent surgical history. ? ? ? ?Review of Systems  ?Constitutional:  Negative for appetite change, chills, fatigue, fever and unexpected weight change.  ?Eyes:  Negative for visual disturbance.  ?Respiratory:  Negative for cough, chest tightness, shortness of breath and wheezing.   ?Cardiovascular:  Negative for chest pain and leg swelling.  ?Gastrointestinal:  Negative for abdominal pain, constipation, diarrhea, nausea and vomiting.  ?Genitourinary:  Negative for dysuria, flank pain, frequency, genital sores, hematuria and urgency.  ?Skin:  Negative for rash.  ?Allergic/Immunologic: Negative for immunocompromised state.  ?Neurological:  Negative for dizziness and headaches.  ?   ?Objective:  ?  ?BP 120/80   Pulse 67   Temp 98 ?F (36.7 ?C) (Oral)   Resp 16   Ht 5\' 7"  (1.702 m)   Wt 138 lb (62.6 kg)   SpO2 99%   BMI 21.61 kg/m?  ?Nursing note and vital signs reviewed. ? ?Physical Exam ?Constitutional:   ?   General: He is not in acute distress. ?   Appearance: He is well-developed.  ?Eyes:  ?   Conjunctiva/sclera: Conjunctivae normal.  ?Cardiovascular:  ?   Rate and Rhythm: Normal rate and regular rhythm.  ?   Heart sounds: Normal heart sounds. No murmur heard. ?  No friction  rub. No gallop.  ?Pulmonary:  ?   Effort: Pulmonary effort is normal. No respiratory distress.  ?   Breath sounds: Normal breath sounds. No wheezing or rales.  ?Chest:  ?   Chest wall: No tenderness.  ?Abdominal:  ?   General: Bowel sounds are normal.  ?   Palpations: Abdomen is soft.  ?   Tenderness: There is no abdominal tenderness.  ?Musculoskeletal:  ?   Cervical back: Neck supple.  ?Lymphadenopathy:  ?   Cervical: No cervical adenopathy.  ?Skin: ?   General: Skin is warm and dry.  ?   Findings: No rash.  ?Neurological:  ?    Mental Status: He is alert and oriented to person, place, and time.  ?Psychiatric:     ?   Behavior: Behavior normal.     ?   Thought Content: Thought content normal.     ?   Judgment: Judgment normal.  ? ? ? ? ?  01/14/2021  ?  3:36 PM 06/30/2020  ?  4:20 PM 10/29/2019  ?  2:24 PM 05/13/2015  ?  1:45 PM 05/13/2015  ?  1:36 PM  ?Depression screen PHQ 2/9  ?Decreased Interest 0 1 0 0 0  ?Down, Depressed, Hopeless 0 1 0 0 0  ?PHQ - 2 Score 0 2 0 0 0  ?Altered sleeping  0     ?Tired, decreased energy  0     ?Change in appetite  0     ?Feeling bad or failure about yourself   1     ?Trouble concentrating  0     ?Moving slowly or fidgety/restless  0     ?Suicidal thoughts  0     ?PHQ-9 Score  3     ?  ?   ?Assessment & Plan:  ? ? ?Patient Active Problem List  ? Diagnosis Date Noted  ? Asymptomatic HIV infection (HCC) 10/29/2019  ? Healthcare maintenance 10/29/2019  ? ? ? ?Problem List Items Addressed This Visit   ? ?  ? Other  ? Asymptomatic HIV infection (HCC) - Primary  ?  Leonard Pham has well-controlled virus with good adherence and tolerance to Biktarvy.  We reviewed previous lab work and discussed plan of care.  Check blood work today.  Continue current dose of Biktarvy.  Plan for follow-up in 6 months or sooner if needed with lab work on the same day. ? ?  ?  ? Relevant Medications  ? bictegravir-emtricitabine-tenofovir AF (BIKTARVY) 50-200-25 MG TABS tablet  ? Other Relevant Orders  ? Comprehensive metabolic panel  ? T-helper cell (CD4)- ([facility] clinic only)  ? HIV-1 RNA quant-no reflex-bld  ? Healthcare maintenance  ?  Discussed importance of safe sexual practices and condom use.  Condoms offered. ?Menveo and Prevnar 20 updated. ?Routine dental care up-to-date. ?STD testing per request. ?  ?  ? ?Other Visit Diagnoses   ? ? Screening for STDs (sexually transmitted diseases)      ? Relevant Orders  ? RPR  ? Cytology (oral, anal, urethral) ancillary only  ? Cytology (oral, anal, urethral) ancillary only  ? Urine cytology  ancillary only  ? Pharmacologic therapy      ? Relevant Orders  ? Lipid panel  ? Need for meningitis vaccination      ? Relevant Orders  ? MENINGOCOCCAL MCV4O(MENVEO) (Completed)  ? Need for pneumococcal vaccination      ? Relevant Orders  ? Pneumococcal conjugate vaccine 20-valent (Prevnar-20) (Completed)  ? ?  ? ? ? ?  I have changed Leonard Pham "[name]"'s Biktarvy. I am also having him maintain his albuterol. ? ? ?Meds ordered this encounter  ?Medications  ? bictegravir-emtricitabine-tenofovir AF (BIKTARVY) 50-200-25 MG TABS tablet  ?  Sig: Take 1 tablet by mouth daily.  ?  Dispense:  30 tablet  ?  Refill:  7  ?  Order Specific Question:   Supervising Provider  ?  Answer:   Judyann Munson [[id number]]  ? ? ? ?Follow-up: Return in about 6 months (around 08/11/2022), or if symptoms worsen or fail to improve. ? ? ?Marcos Eke, MSN, FNP-C ?Nurse Practitioner ?[facility] ?[facility] ?[facility] Main number: [phone] ? ? ?

## 2022-02-09 NOTE — Assessment & Plan Note (Addendum)
?   Discussed importance of safe sexual practices and condom use.  Condoms offered. ?? Menveo and Prevnar 20 updated. ?? Routine dental care up-to-date. ?? STD testing per request. ?

## 2022-02-09 NOTE — Patient Instructions (Signed)
Nice to see you. ? ?We will check your lab work today. ? ?Continue to take your medication daily as prescribed. ? ?Refills have been sent to the pharmacy. ? ?Plan for follow up in 6 months or sooner if needed with lab work on the same day. ? ?Have a great day and stay safe! ? ?

## 2022-02-10 LAB — CYTOLOGY, (ORAL, ANAL, URETHRAL) ANCILLARY ONLY
Chlamydia: NEGATIVE
Chlamydia: NEGATIVE
Comment: NEGATIVE
Comment: NEGATIVE
Comment: NORMAL
Comment: NORMAL
Neisseria Gonorrhea: NEGATIVE
Neisseria Gonorrhea: NEGATIVE

## 2022-02-10 LAB — URINE CYTOLOGY ANCILLARY ONLY
Chlamydia: NEGATIVE
Comment: NEGATIVE
Comment: NORMAL
Neisseria Gonorrhea: NEGATIVE

## 2022-02-11 LAB — HELPER T-LYMPH-CD4 (ARMC ONLY)
% CD 4 Pos. Lymph.: 24.8 % — ABNORMAL LOW (ref 30.8–58.5)
Absolute CD 4 Helper: 546 /uL (ref 359–1519)
Basophils Absolute: 0 10*3/uL (ref 0.0–0.2)
Basos: 1 %
EOS (ABSOLUTE): 0.3 10*3/uL (ref 0.0–0.4)
Eos: 5 %
Hematocrit: 50.2 % (ref 37.5–51.0)
Hemoglobin: 16.4 g/dL (ref 13.0–17.7)
Immature Grans (Abs): 0 10*3/uL (ref 0.0–0.1)
Immature Granulocytes: 0 %
Lymphocytes Absolute: 2.2 10*3/uL (ref 0.7–3.1)
Lymphs: 34 %
MCH: 29.2 pg (ref 26.6–33.0)
MCHC: 32.7 g/dL (ref 31.5–35.7)
MCV: 90 fL (ref 79–97)
Monocytes Absolute: 0.5 10*3/uL (ref 0.1–0.9)
Monocytes: 8 %
Neutrophils Absolute: 3.3 10*3/uL (ref 1.4–7.0)
Neutrophils: 52 %
Platelets: 296 10*3/uL (ref 150–450)
RBC: 5.61 x10E6/uL (ref 4.14–5.80)
RDW: 14.3 % (ref 11.6–15.4)
WBC: 6.4 10*3/uL (ref 3.4–10.8)

## 2022-02-11 LAB — T-HELPER CELL (CD4) - (RCID CLINIC ONLY)

## 2022-02-12 LAB — COMPREHENSIVE METABOLIC PANEL
AG Ratio: 1.4 (calc) (ref 1.0–2.5)
ALT: 14 U/L (ref 9–46)
AST: 25 U/L (ref 10–40)
Albumin: 4.9 g/dL (ref 3.6–5.1)
Alkaline phosphatase (APISO): 69 U/L (ref 36–130)
BUN: 10 mg/dL (ref 7–25)
CO2: 27 mmol/L (ref 20–32)
Calcium: 10.4 mg/dL — ABNORMAL HIGH (ref 8.6–10.3)
Chloride: 99 mmol/L (ref 98–110)
Creat: 1.04 mg/dL (ref 0.60–1.24)
Globulin: 3.6 g/dL (calc) (ref 1.9–3.7)
Glucose, Bld: 80 mg/dL (ref 65–99)
Potassium: 3.9 mmol/L (ref 3.5–5.3)
Sodium: 137 mmol/L (ref 135–146)
Total Bilirubin: 2.2 mg/dL — ABNORMAL HIGH (ref 0.2–1.2)
Total Protein: 8.5 g/dL — ABNORMAL HIGH (ref 6.1–8.1)

## 2022-02-12 LAB — HIV-1 RNA QUANT-NO REFLEX-BLD
HIV 1 RNA Quant: NOT DETECTED copies/mL
HIV-1 RNA Quant, Log: NOT DETECTED Log copies/mL

## 2022-02-12 LAB — FLUORESCENT TREPONEMAL AB(FTA)-IGG-BLD: Fluorescent Treponemal ABS: REACTIVE — AB

## 2022-02-12 LAB — LIPID PANEL
Cholesterol: 180 mg/dL (ref <200)
HDL: 63 mg/dL (ref >=40)
LDL Cholesterol (Calc): 96 mg/dL (calc)
Non-HDL Cholesterol (Calc): 117 mg/dL (calc) (ref <130)
Total CHOL/HDL Ratio: 2.9 (calc) (ref <5.0)
Triglycerides: 117 mg/dL (ref <150)

## 2022-02-12 LAB — RPR TITER: RPR Titer: 1:8 {titer} — ABNORMAL HIGH

## 2022-02-12 LAB — RPR: RPR Ser Ql: REACTIVE — AB

## 2022-07-06 DIAGNOSIS — Z23 Encounter for immunization: Secondary | ICD-10-CM | POA: Diagnosis not present

## 2022-08-15 ENCOUNTER — Ambulatory Visit (INDEPENDENT_AMBULATORY_CARE_PROVIDER_SITE_OTHER): Payer: BC Managed Care – PPO | Admitting: Family

## 2022-08-15 ENCOUNTER — Ambulatory Visit: Payer: Managed Care, Other (non HMO) | Admitting: Family

## 2022-08-15 ENCOUNTER — Encounter: Payer: Self-pay | Admitting: Family

## 2022-08-15 ENCOUNTER — Other Ambulatory Visit: Payer: Self-pay

## 2022-08-15 VITALS — BP 129/79 | HR 65 | Temp 99.0°F | Ht 67.0 in | Wt 144.0 lb

## 2022-08-15 DIAGNOSIS — Z0000 Encounter for general adult medical examination without abnormal findings: Secondary | ICD-10-CM

## 2022-08-15 DIAGNOSIS — Z21 Asymptomatic human immunodeficiency virus [HIV] infection status: Secondary | ICD-10-CM | POA: Diagnosis not present

## 2022-08-15 DIAGNOSIS — Z113 Encounter for screening for infections with a predominantly sexual mode of transmission: Secondary | ICD-10-CM

## 2022-08-15 MED ORDER — BIKTARVY 50-200-25 MG PO TABS: 1.0000 | ORAL_TABLET | Freq: Every day | ORAL | 11 refills | Status: DC

## 2022-08-15 NOTE — Patient Instructions (Addendum)
Nice to see you.  We will check your lab work today.  Continue to take your medication daily as prescribed.  Refills have been sent to the pharmacy.  Plan for follow up in 6 months or sooner if needed with lab work on the same day.  Have a great day and stay safe!   Human Papillomavirus (HPV) Vaccine Injection What is this medication? HUMAN PAPILLOMAVIRUS VACCINE (HYOO muhn pap uh LOH muh vahy ruhs vak SEEN) reduces the risk of human papillomavirus (HPV). It does not treat HPV. It is still possible to get HPV after receiving this vaccine, but the symptoms may be less severe or not last as long. It works by helping your immune system learn how to fight off a future infection. This medicine may be used for other purposes; ask your health care provider or pharmacist if you have questions. COMMON BRAND NAME(S): Gardasil 9 What should I tell my care team before I take this medication? They need to know if you have any of these conditions: Fever Hemophilia HIV or AIDS Immune system problems Infection Low platelets An unusual reaction to human papillomavirus vaccine, yeast, other vaccines, other medications, foods, dyes, or preservatives Pregnant or trying to get pregnant Breastfeeding How should I use this medication? This vaccine is injected into a muscle. It is given by your care team. This vaccine requires 2 or 3 doses to get the full benefit. Set a reminder for when your next dose is due. A copy of the Vaccine Information Statement will be given before each vaccination. Be sure to read this information carefully each time. This sheet may change often. Talk to your care team about the use of this medication in children. While it may be prescribed for children as young as 9 years for selected conditions, precautions do apply. Overdosage: If you think you have taken too much of this medicine contact a poison control center or emergency room at once. NOTE: This medicine is only for you.  Do not share this medicine with others. What if I miss a dose? Keep appointments for follow-up doses as directed. It is important not to miss your dose. Call your care team if you are unable to keep an appointment. What may interact with this medication? Certain medications for arthritis Medications for organ transplant Medications to treat cancer Steroid medications, such as prednisone or cortisone This list may not describe all possible interactions. Give your health care provider a list of all the medicines, herbs, non-prescription drugs, or dietary supplements you use. Also tell them if you smoke, drink alcohol, or use illegal drugs. Some items may interact with your medicine. What should I watch for while using this medication? Visit your care team regularly. Report any side effects to your care team right away. This vaccine, like all vaccines, may not fully protect everyone. What side effects may I notice from receiving this medication? Side effects that you should report to your care team as soon as possible: Allergic reactions--skin rash, itching, hives, swelling of the face, lips, tongue, or throat Feeling faint or lightheaded Side effects that usually do not require medical attention (report these to your care team if they continue or are bothersome): Diarrhea Dizziness Fatigue Fever Headache Nausea Pain, redness, irritation, or bruising at the injection site This list may not describe all possible side effects. Call your doctor for medical advice about side effects. You may report side effects to FDA at 1-800-FDA-1088. Where should I keep my medication? This vaccine is only  given by your care team. It will not be stored at home. NOTE: This sheet is a summary. It may not cover all possible information. If you have questions about this medicine, talk to your doctor, pharmacist, or health care provider.  2023 Elsevier/Gold Standard (2022-03-15 00:00:00)

## 2022-08-15 NOTE — Assessment & Plan Note (Addendum)
Leonard Pham continues to have well controlled virus with good adherence and tolerance to [employer]. Reviewed previous lab work and discussed plan of care. Check lab work today. Continue current dose of Biktarvy. Provided pharmacy information should he have further challenges obtaining medication.  Plan for follow up in 8 months or sooner if needed with lab work on the same day.

## 2022-08-15 NOTE — Assessment & Plan Note (Signed)
   Discussed importance of safe sexual practice, family planning and condom use. Condoms and STD testing offered.   Declines HPV vaccine with information provided in AVS.

## 2022-08-15 NOTE — Progress Notes (Signed)
Brief Narrative   Patient ID: Leonard Pham, male    DOB: [birth year], 26 y.o.   MRN: [id number]  Leonard Pham is a 26 y/o AA male diagnosed with HIV in December 2020. CD4 nair of 277 with initial viral load of 99,000. Risk factor for HIV is MSM. No history of opportunistic infection. Genotype with no significant mutations. [id number] negative. Enters care at [facility] Stage 2. No previous regimens prior to [employer].  Subjective:    Chief Complaint  Patient presents with   Follow-up    HPI:  Leonard Pham is a 26 y.o. male with HIV disease last seen on 04/27/21 with well controlled virus and good adherence and tolerance to Biktarvy.Viral load was undetectable and CD4 count 450. Most recent lab work completed on 02/09/22 with viral load undetectable and CD4 coun 546. Kidney function, liver function, and electrolytes within normal ranges. Here today for routine follow up.   Leonard Pham has been doing well since his last office visit. Teaching full time. Continues to take his Biktarvy with occasional missed dose and no adverse side effects. Does have some issues getting medication secondary to insurance which appears to be okay for now. Has questions about long acting injectables. Condoms and STD testing offered. Health care maintenance due for HPV vaccination.   Denies fevers, chills, night sweats, headaches, changes in vision, neck pain/stiffness, nausea, diarrhea, vomiting, lesions or rashes.   No Known Allergies    Outpatient Medications Prior to Visit  Medication Sig Dispense Refill   bictegravir-emtricitabine-tenofovir AF (BIKTARVY) 50-200-25 MG TABS tablet Take 1 tablet by mouth daily. 30 tablet 7   albuterol (VENTOLIN HFA) 108 (90 Base) MCG/ACT inhaler SMARTSIG:2 Puff(s) By Mouth Every 6 Hours PRN     No facility-administered medications prior to visit.     Past Medical History:  Diagnosis Date   Asthma    had asthma as young child but no symptoms or treatment  for 'years'   HIV (human immunodeficiency virus infection) ([place]) 10/29/2019   Dx 09/2019     History reviewed. No pertinent surgical history.    Review of Systems  Constitutional:  Negative for appetite change, chills, fatigue, fever and unexpected weight change.  Eyes:  Negative for visual disturbance.  Respiratory:  Negative for cough, chest tightness, shortness of breath and wheezing.   Cardiovascular:  Negative for chest pain and leg swelling.  Gastrointestinal:  Negative for abdominal pain, constipation, diarrhea, nausea and vomiting.  Genitourinary:  Negative for dysuria, flank pain, frequency, genital sores, hematuria and urgency.  Skin:  Negative for rash.  Allergic/Immunologic: Negative for immunocompromised state.  Neurological:  Negative for dizziness and headaches.      Objective:    BP 129/79   Pulse 65   Temp 99 F (37.2 C) (Oral)   Ht 5\' 7"  (1.702 m)   Wt 144 lb (65.3 kg)   SpO2 98%   BMI 22.55 kg/m  Nursing note and vital signs reviewed.  Physical Exam Constitutional:      General: He is not in acute distress.    Appearance: He is well-developed.  Eyes:     Conjunctiva/sclera: Conjunctivae normal.  Cardiovascular:     Rate and Rhythm: Normal rate and regular rhythm.     Heart sounds: Normal heart sounds. No murmur heard.    No friction rub. No gallop.  Pulmonary:     Effort: Pulmonary effort is normal. No respiratory distress.     Breath sounds: Normal breath sounds. No  wheezing or rales.  Chest:     Chest wall: No tenderness.  Abdominal:     General: Bowel sounds are normal.     Palpations: Abdomen is soft.     Tenderness: There is no abdominal tenderness.  Musculoskeletal:     Cervical back: Neck supple.  Lymphadenopathy:     Cervical: No cervical adenopathy.  Skin:    General: Skin is warm and dry.     Findings: No rash.  Neurological:     Mental Status: He is alert and oriented to person, place, and time.  Psychiatric:         Behavior: Behavior normal.        Thought Content: Thought content normal.        Judgment: Judgment normal.         08/15/2022    1:36 PM 01/14/2021    3:36 PM 06/30/2020    4:20 PM 10/29/2019    2:24 PM 05/13/2015    1:45 PM  Depression screen PHQ 2/9  Decreased Interest 0 0 1 0 0  Down, Depressed, Hopeless 0 0 1 0 0  PHQ - 2 Score 0 0 2 0 0  Altered sleeping   0    Tired, decreased energy   0    Change in appetite   0    Feeling bad or failure about yourself    1    Trouble concentrating   0    Moving slowly or fidgety/restless   0    Suicidal thoughts   0    PHQ-9 Score   3         Assessment & Plan:    Patient Active Problem List   Diagnosis Date Noted   Asymptomatic HIV infection (HCC) 10/29/2019   Healthcare maintenance 10/29/2019     Problem List Items Addressed This Visit       Other   Asymptomatic HIV infection (HCC) - Primary    Leonard Pham continues to have well controlled virus with good adherence and tolerance to Biktarvy. Reviewed previous lab work and discussed plan of care. Check lab work today. Continue current dose of Biktarvy. Provided pharmacy information should he have further challenges obtaining medication.  Plan for follow up in 8 months or sooner if needed with lab work on the same day.       Relevant Medications   bictegravir-emtricitabine-tenofovir AF (BIKTARVY) 50-200-25 MG TABS tablet   Other Relevant Orders   Comprehensive metabolic panel   HIV-1 RNA quant-no reflex-bld   T-helper cell (CD4)- ([facility] clinic only)   Healthcare maintenance    Discussed importance of safe sexual practice, family planning and condom use. Condoms and STD testing offered.  Declines HPV vaccine with information provided in AVS.       Other Visit Diagnoses     Screening for venereal disease (VD)       Relevant Orders   RPR        I have discontinued Leonard D. Hartsough Pham "Leonard Pham"'s albuterol. I am also having him maintain his Biktarvy.   Meds  ordered this encounter  Medications   bictegravir-emtricitabine-tenofovir AF (BIKTARVY) 50-200-25 MG TABS tablet    Sig: Take 1 tablet by mouth daily.    Dispense:  30 tablet    Refill:  11    Order Specific Question:   Supervising Provider    Answer:   Judyann Munson [[id number]]     Follow-up: Return in about 8 months (around 04/16/2023), or if symptoms worsen  or fail to improve.   Terri Piedra, MSN, FNP-C Nurse Practitioner [facility] for Infectious Disease [place] number: [phone]

## 2022-08-16 LAB — T-HELPER CELL (CD4) - (RCID CLINIC ONLY)
CD4 % Helper T Cell: 30 % — ABNORMAL LOW (ref 33–65)
CD4 T Cell Abs: 578 /uL (ref 400–1790)

## 2022-08-17 LAB — COMPREHENSIVE METABOLIC PANEL
AG Ratio: 1.4 (calc) (ref 1.0–2.5)
ALT: 28 U/L (ref 9–46)
AST: 55 U/L — ABNORMAL HIGH (ref 10–40)
Albumin: 4.4 g/dL (ref 3.6–5.1)
Alkaline phosphatase (APISO): 54 U/L (ref 36–130)
BUN/Creatinine Ratio: 7 (calc) (ref 6–22)
BUN: 6 mg/dL — ABNORMAL LOW (ref 7–25)
CO2: 27 mmol/L (ref 20–32)
Calcium: 9.9 mg/dL (ref 8.6–10.3)
Chloride: 104 mmol/L (ref 98–110)
Creat: 0.9 mg/dL (ref 0.60–1.24)
Globulin: 3.1 g/dL (calc) (ref 1.9–3.7)
Glucose, Bld: 83 mg/dL (ref 65–99)
Potassium: 4 mmol/L (ref 3.5–5.3)
Sodium: 140 mmol/L (ref 135–146)
Total Bilirubin: 1.1 mg/dL (ref 0.2–1.2)
Total Protein: 7.5 g/dL (ref 6.1–8.1)

## 2022-08-17 LAB — HIV-1 RNA QUANT-NO REFLEX-BLD
HIV 1 RNA Quant: <20 Copies/mL — ABNORMAL HIGH
HIV-1 RNA Quant, Log: <1.3 Log cps/mL — ABNORMAL HIGH

## 2022-08-17 LAB — RPR: RPR Ser Ql: REACTIVE — AB

## 2022-08-17 LAB — FLUORESCENT TREPONEMAL AB(FTA)-IGG-BLD: Fluorescent Treponemal ABS: REACTIVE — AB

## 2022-08-17 LAB — RPR TITER: RPR Titer: 1:8 {titer} — ABNORMAL HIGH

## 2022-12-30 DIAGNOSIS — F411 Generalized anxiety disorder: Secondary | ICD-10-CM | POA: Diagnosis not present

## 2023-01-07 DIAGNOSIS — F411 Generalized anxiety disorder: Secondary | ICD-10-CM | POA: Diagnosis not present

## 2023-01-13 DIAGNOSIS — F411 Generalized anxiety disorder: Secondary | ICD-10-CM | POA: Diagnosis not present

## 2023-01-20 DIAGNOSIS — F411 Generalized anxiety disorder: Secondary | ICD-10-CM | POA: Diagnosis not present

## 2023-02-02 DIAGNOSIS — F411 Generalized anxiety disorder: Secondary | ICD-10-CM | POA: Diagnosis not present

## 2023-02-06 ENCOUNTER — Other Ambulatory Visit: Payer: Self-pay

## 2023-02-06 ENCOUNTER — Encounter: Payer: Self-pay | Admitting: Family

## 2023-02-06 ENCOUNTER — Ambulatory Visit (INDEPENDENT_AMBULATORY_CARE_PROVIDER_SITE_OTHER): Payer: BC Managed Care – PPO | Admitting: Family

## 2023-02-06 VITALS — BP 129/84 | HR 67 | Ht 67.0 in | Wt 145.0 lb

## 2023-02-06 DIAGNOSIS — Z21 Asymptomatic human immunodeficiency virus [HIV] infection status: Secondary | ICD-10-CM | POA: Diagnosis not present

## 2023-02-06 DIAGNOSIS — L989 Disorder of the skin and subcutaneous tissue, unspecified: Secondary | ICD-10-CM

## 2023-02-06 DIAGNOSIS — Z0000 Encounter for general adult medical examination without abnormal findings: Secondary | ICD-10-CM | POA: Diagnosis not present

## 2023-02-06 MED ORDER — BIKTARVY 50-200-25 MG PO TABS: 1.0000 | ORAL_TABLET | Freq: Every day | ORAL | 11 refills | Status: DC

## 2023-02-06 NOTE — Assessment & Plan Note (Signed)
Leonard Pham has a small, dimes sized discoloration on his right thigh that is without induration or signs of infection. Would recommend dermatology assessment for possible skin biopsy as cannot rule out skin maligancy although I feel this would be less likely. Continue to monitor for now.

## 2023-02-06 NOTE — Assessment & Plan Note (Signed)
Leonard Pham continues to have well controlled virus with good adherence and tolerance to [employer]. Reviewed previous lab work and discussed plan of care. Continue current dose of Biktarvy and check lab work. Plan for follow up in 6 months or sooner if needed with lab work on the same day.

## 2023-02-06 NOTE — Patient Instructions (Addendum)
Nice to see you. ? ?We will check your lab work today. ? ?Continue to take your medication daily as prescribed. ? ?Refills have been sent to the pharmacy. ? ?Plan for follow up in 6 months or sooner if needed with lab work on the same day. ? ?Have a great day and stay safe! ? ?

## 2023-02-06 NOTE — Assessment & Plan Note (Signed)
Discussed importance of safe sexual practice and condom use. Condoms and STD testing offered.  Encouraged routine dental care which will be scheduled independently and can refer to [facility] if needed.  Declined vaccines.

## 2023-02-06 NOTE — Progress Notes (Signed)
Brief Narrative   Patient ID: Leonard Pham, male    DOB: 1996/03/10, 27 y.o.   MRN: [id number]  Leonard Pham is a 27 y/o AA male diagnosed with HIV in December 2020. CD4 nair of 277 with initial viral load of 99,000. Risk factor for HIV is MSM. No history of opportunistic infection. Genotype with no significant mutations. [id number] negative. Enters care at [facility] Stage 2. No previous regimens prior to [employer].   Subjective:    Chief Complaint  Patient presents with   Follow-up    B20    HPI:  Leonard Pham is a 27 y.o. male with HIV disease last seen on 08/05/22 with well controlled virus and good adherence and tolerance to [employer].  Viral load was undetectable with CD4 count of 578.  RPR was down to 1: 8 from treatment level of 1:64. Kidney function and electrolytes within normal ranges. Here today for follow up.  Leonard Pham has been doing well since his last office visit and has concern for a spot on his right upper thigh that has been going on for about 1 years and has not changed and not itchy or painful. No treatments performed. Continues to take Biktarvy with no adverse side effects or problems obtaining medication from the pharmacy. Condoms and STD testing offered. Declines vaccinations. Due for routine dental exam.   Denies fevers, chills, night sweats, headaches, changes in vision, neck pain/stiffness, nausea, diarrhea, vomiting, or rashes.   No Known Allergies    Outpatient Medications Prior to Visit  Medication Sig Dispense Refill   bictegravir-emtricitabine-tenofovir AF (BIKTARVY) 50-200-25 MG TABS tablet Take 1 tablet by mouth daily. 30 tablet 11   No facility-administered medications prior to visit.     Past Medical History:  Diagnosis Date   Asthma    had asthma as young child but no symptoms or treatment for 'years'   HIV (human immunodeficiency virus infection) 10/29/2019   Dx 09/2019     No past surgical history on  file.    Review of Systems  Constitutional:  Negative for appetite change, chills, fatigue, fever and unexpected weight change.  Eyes:  Negative for visual disturbance.  Respiratory:  Negative for cough, chest tightness, shortness of breath and wheezing.   Cardiovascular:  Negative for chest pain and leg swelling.  Gastrointestinal:  Negative for abdominal pain, constipation, diarrhea, nausea and vomiting.  Genitourinary:  Negative for dysuria, flank pain, frequency, genital sores, hematuria and urgency.  Skin:  Negative for rash.  Allergic/Immunologic: Negative for immunocompromised state.  Neurological:  Negative for dizziness and headaches.      Objective:    BP 129/84   Pulse 67   Ht  (1.702 m)   Wt 145 lb (65.8 kg)   BMI 22.71 kg/m  Nursing note and vital signs reviewed.  Physical Exam Constitutional:      General: He is not in acute distress.    Appearance: He is well-developed.  Eyes:     Conjunctiva/sclera: Conjunctivae normal.  Cardiovascular:     Rate and Rhythm: Normal rate and regular rhythm.     Heart sounds: Normal heart sounds. No murmur heard.    No friction rub. No gallop.  Pulmonary:     Effort: Pulmonary effort is normal. No respiratory distress.     Breath sounds: Normal breath sounds. No wheezing or rales.  Chest:     Chest wall: No tenderness.  Abdominal:     General: Bowel sounds are normal.  Palpations: Abdomen is soft.     Tenderness: There is no abdominal tenderness.  Musculoskeletal:     Cervical back: Neck supple.  Lymphadenopathy:     Cervical: No cervical adenopathy.  Skin:    General: Skin is warm and dry.     Findings: No rash.  Neurological:     Mental Status: He is alert and oriented to person, place, and time.  Psychiatric:        Behavior: Behavior normal.        Thought Content: Thought content normal.        Judgment: Judgment normal.         02/06/2023    2:17 PM 08/15/2022    1:36 PM 01/14/2021    3:36 PM  06/30/2020    4:20 PM 10/29/2019    2:24 PM  Depression screen PHQ 2/9  Decreased Interest 0 0 0 1 0  Down, Depressed, Hopeless 0 0 0 1 0  PHQ - 2 Score 0 0 0 2 0  Altered sleeping    0   Tired, decreased energy    0   Change in appetite    0   Feeling bad or failure about yourself     1   Trouble concentrating    0   Moving slowly or fidgety/restless    0   Suicidal thoughts    0   PHQ-9 Score    3        Assessment & Plan:    Patient Active Problem List   Diagnosis Date Noted   Skin lesion 02/06/2023   Asymptomatic HIV infection 10/29/2019   Healthcare maintenance 10/29/2019     Problem List Items Addressed This Visit       Musculoskeletal and Integument   Skin lesion    Leonard Pham has a small, dimes sized discoloration on his right thigh that is without induration or signs of infection. Would recommend dermatology assessment for possible skin biopsy as cannot rule out skin maligancy although I feel this would be less likely. Continue to monitor for now.         Other   Asymptomatic HIV infection - Primary    Leonard Pham continues to have well controlled virus with good adherence and tolerance to Biktarvy. Reviewed previous lab work and discussed plan of care. Continue current dose of Biktarvy and check lab work. Plan for follow up in 6 months or sooner if needed with lab work on the same day.       Relevant Medications   bictegravir-emtricitabine-tenofovir AF (BIKTARVY) 50-200-25 MG TABS tablet   Other Relevant Orders   COMPLETE METABOLIC PANEL WITH GFR   HIV-1 RNA quant-no reflex-bld   T-helper cell (CD4)- ([facility] clinic only)   Healthcare maintenance    Discussed importance of safe sexual practice and condom use. Condoms and STD testing offered.  Encouraged routine dental care which will be scheduled independently and can refer to [facility] if needed.  Declined vaccines.         I am having Leonard Pham "Leonard Pham" maintain his Biktarvy.   Meds ordered this  encounter  Medications   bictegravir-emtricitabine-tenofovir AF (BIKTARVY) 50-200-25 MG TABS tablet    Sig: Take 1 tablet by mouth daily.    Dispense:  30 tablet    Refill:  11    Order Specific Question:   Supervising Provider    Answer:   Judyann Munson [[id number]]     Follow-up: Return in about 6 months (around  08/08/2023), or if symptoms worsen or fail to improve.   Marcos Eke, MSN, FNP-C Nurse Practitioner [facility] for Infectious Disease [facility] Main number: [phone]

## 2023-02-07 LAB — T-HELPER CELL (CD4) - (RCID CLINIC ONLY)
CD4 % Helper T Cell: 35 % (ref 33–65)
CD4 T Cell Abs: 913 /uL (ref 400–1790)

## 2023-02-09 LAB — COMPLETE METABOLIC PANEL WITH GFR
AG Ratio: 1.5 (calc) (ref 1.0–2.5)
ALT: 13 U/L (ref 9–46)
AST: 24 U/L (ref 10–40)
Albumin: 4.4 g/dL (ref 3.6–5.1)
Alkaline phosphatase (APISO): 66 U/L (ref 36–130)
BUN: 7 mg/dL (ref 7–25)
CO2: 28 mmol/L (ref 20–32)
Calcium: 9.2 mg/dL (ref 8.6–10.3)
Chloride: 103 mmol/L (ref 98–110)
Creat: 0.87 mg/dL (ref 0.60–1.24)
Globulin: 2.9 g/dL (calc) (ref 1.9–3.7)
Glucose, Bld: 79 mg/dL (ref 65–99)
Potassium: 4.1 mmol/L (ref 3.5–5.3)
Sodium: 140 mmol/L (ref 135–146)
Total Bilirubin: 0.9 mg/dL (ref 0.2–1.2)
Total Protein: 7.3 g/dL (ref 6.1–8.1)
eGFR: 123 mL/min/{1.73_m2} (ref >=60)

## 2023-02-09 LAB — HIV-1 RNA QUANT-NO REFLEX-BLD
HIV 1 RNA Quant: <20 {copies}/mL — ABNORMAL HIGH
HIV-1 RNA Quant, Log: <1.3 {Log_copies}/mL — ABNORMAL HIGH

## 2023-02-16 DIAGNOSIS — F411 Generalized anxiety disorder: Secondary | ICD-10-CM | POA: Diagnosis not present

## 2023-02-22 DIAGNOSIS — F411 Generalized anxiety disorder: Secondary | ICD-10-CM | POA: Diagnosis not present

## 2023-03-07 DIAGNOSIS — F411 Generalized anxiety disorder: Secondary | ICD-10-CM | POA: Diagnosis not present

## 2023-03-23 DIAGNOSIS — F411 Generalized anxiety disorder: Secondary | ICD-10-CM | POA: Diagnosis not present

## 2023-04-13 DIAGNOSIS — F411 Generalized anxiety disorder: Secondary | ICD-10-CM | POA: Diagnosis not present

## 2023-04-27 DIAGNOSIS — F411 Generalized anxiety disorder: Secondary | ICD-10-CM | POA: Diagnosis not present

## 2023-05-11 DIAGNOSIS — F411 Generalized anxiety disorder: Secondary | ICD-10-CM | POA: Diagnosis not present

## 2023-06-01 DIAGNOSIS — F411 Generalized anxiety disorder: Secondary | ICD-10-CM | POA: Diagnosis not present

## 2023-06-22 DIAGNOSIS — F411 Generalized anxiety disorder: Secondary | ICD-10-CM | POA: Diagnosis not present

## 2023-07-06 DIAGNOSIS — F411 Generalized anxiety disorder: Secondary | ICD-10-CM | POA: Diagnosis not present

## 2023-07-31 DIAGNOSIS — F411 Generalized anxiety disorder: Secondary | ICD-10-CM | POA: Diagnosis not present

## 2023-08-14 ENCOUNTER — Other Ambulatory Visit: Payer: Self-pay

## 2023-08-14 ENCOUNTER — Ambulatory Visit (INDEPENDENT_AMBULATORY_CARE_PROVIDER_SITE_OTHER): Payer: BC Managed Care – PPO | Admitting: Family

## 2023-08-14 ENCOUNTER — Other Ambulatory Visit (HOSPITAL_COMMUNITY)
Admission: RE | Admit: 2023-08-14 | Discharge: 2023-08-14 | Disposition: A | Discharge status: Home / Self Care | Payer: BC Managed Care – PPO | Source: Ambulatory Visit | Attending: Family | Admitting: Family

## 2023-08-14 ENCOUNTER — Encounter: Payer: Self-pay | Admitting: Family

## 2023-08-14 VITALS — BP 123/80 | HR 60 | Temp 98.3°F | Ht 67.0 in | Wt 147.0 lb

## 2023-08-14 DIAGNOSIS — Z21 Asymptomatic human immunodeficiency virus [HIV] infection status: Secondary | ICD-10-CM | POA: Diagnosis not present

## 2023-08-14 DIAGNOSIS — Z0000 Encounter for general adult medical examination without abnormal findings: Secondary | ICD-10-CM

## 2023-08-14 DIAGNOSIS — Z113 Encounter for screening for infections with a predominantly sexual mode of transmission: Secondary | ICD-10-CM | POA: Diagnosis not present

## 2023-08-14 DIAGNOSIS — A539 Syphilis, unspecified: Secondary | ICD-10-CM | POA: Insufficient documentation

## 2023-08-14 MED ORDER — BIKTARVY 50-200-25 MG PO TABS: 1.0000 | ORAL_TABLET | Freq: Every day | ORAL | 5 refills | Status: DC

## 2023-08-14 NOTE — Assessment & Plan Note (Signed)
Leonard Pham's RPR titer was 1:8 with previous treatment at 1:64. Counseled on RPR testing and serofast and when additional treatment needed. Check RPR.

## 2023-08-14 NOTE — Assessment & Plan Note (Signed)
Leonard Pham continues to have well controlled virus with good adherence and tolerance to [employer].  Reviewed lab work and discussed plan of care, U equals U, and family planning. Check lab work. Continue current dose of Biktarvy. Plan for follow up in  6 months or sooner if needed with lab work on the same day.

## 2023-08-14 NOTE — Patient Instructions (Addendum)
Nice to see you. ? ?We will check your lab work today. ? ?Continue to take your medication daily as prescribed. ? ?Refills have been sent to the pharmacy. ? ?Plan for follow up in 6 months or sooner if needed with lab work on the same day. ? ?Have a great day and stay safe! ? ?

## 2023-08-14 NOTE — Assessment & Plan Note (Signed)
Discussed importance of safe sexual practice and condom use. Condoms and STD testing offered.  Vaccinations reviewed and declined. Will schedule routine dental care independently.

## 2023-08-14 NOTE — Progress Notes (Signed)
Brief Narrative   Patient ID: Leonard Pham, male    DOB: [birth year], 27 y.o.   MRN: [id number]  Mr. Leonard Pham is a 27 y/o AA male diagnosed with HIV in December 2020. CD4 nair of 277 with initial viral load of 99,000. Risk factor for HIV is MSM. No history of opportunistic infection. Genotype with no significant mutations. [id number] negative. Enters care at [facility] Stage 2. No previous regimens prior to [employer].   Subjective:    Chief Complaint  Patient presents with   Follow-up    B20    HPI:  Leonard Pham is a 27 y.o. male with HIV disease last seen on 02/06/23 with well controlled virus and good adherence and tolerance to Biktarvy. Viral load was undetectable and CD4 count 913. RPR titer stable at 1:8 with previous treatment at 1:64 on 10/03/19. Kidney function, liver function and electrolytes are within normal ranges. Here today for follow up.  Leonard Pham has been doing well since last office visit and continues to take [facility] as prescribed with no adverse side effects. Has some problems on occasion remembering if he has taken his medication although has not missed any significant amount of doses. Has tried pill boxes but had challenges keeping up with it. Recently attended homecoming which was a good time. Condoms and STD testing offered. Discussed vaccines. Due for routine dental care.   Denies fevers, chills, night sweats, headaches, changes in vision, neck pain/stiffness, nausea, diarrhea, vomiting, lesions or rashes.  Lab Results  Component Value Date   CD4TCELL 35 02/06/2023   CD4TABS 913 02/06/2023   Lab Results  Component Value Date   HIV1RNAQUANT <20 (H) 02/06/2023     No Known Allergies    Outpatient Medications Prior to Visit  Medication Sig Dispense Refill   bictegravir-emtricitabine-tenofovir AF (BIKTARVY) 50-200-25 MG TABS tablet Take 1 tablet by mouth daily. 30 tablet 11   No facility-administered medications prior to visit.      Past Medical History:  Diagnosis Date   Asthma    had asthma as young child but no symptoms or treatment for 'years'   HIV (human immunodeficiency virus infection) (HCC) 10/29/2019   Dx 09/2019     No past surgical history on file.    Review of Systems  Constitutional:  Negative for appetite change, chills, fatigue, fever and unexpected weight change.  Eyes:  Negative for visual disturbance.  Respiratory:  Negative for cough, chest tightness, shortness of breath and wheezing.   Cardiovascular:  Negative for chest pain and leg swelling.  Gastrointestinal:  Negative for abdominal pain, constipation, diarrhea, nausea and vomiting.  Genitourinary:  Negative for dysuria, flank pain, frequency, genital sores, hematuria and urgency.  Skin:  Negative for rash.  Allergic/Immunologic: Negative for immunocompromised state.  Neurological:  Negative for dizziness and headaches.      Objective:    BP 123/80   Pulse 60   Temp 98.3 F (36.8 C) (Temporal)   Ht 5\' 7"  (1.702 m)   Wt 147 lb (66.7 kg)   SpO2 100%   BMI 23.02 kg/m  Nursing note and vital signs reviewed.  Physical Exam Constitutional:      General: He is not in acute distress.    Appearance: He is well-developed.  Eyes:     Conjunctiva/sclera: Conjunctivae normal.  Cardiovascular:     Rate and Rhythm: Normal rate and regular rhythm.     Heart sounds: Normal heart sounds. No murmur heard.    No friction  rub. No gallop.  Pulmonary:     Effort: Pulmonary effort is normal. No respiratory distress.     Breath sounds: Normal breath sounds. No wheezing or rales.  Chest:     Chest wall: No tenderness.  Abdominal:     General: Bowel sounds are normal.     Palpations: Abdomen is soft.     Tenderness: There is no abdominal tenderness.  Musculoskeletal:     Cervical back: Neck supple.  Lymphadenopathy:     Cervical: No cervical adenopathy.  Skin:    General: Skin is warm and dry.     Findings: No rash.   Neurological:     Mental Status: He is alert and oriented to person, place, and time.  Psychiatric:        Behavior: Behavior normal.        Thought Content: Thought content normal.        Judgment: Judgment normal.         08/14/2023    3:30 PM 02/06/2023    2:17 PM 08/15/2022    1:36 PM 01/14/2021    3:36 PM 06/30/2020    4:20 PM  Depression screen PHQ 2/9  Decreased Interest 0 0 0 0 1  Down, Depressed, Hopeless 0 0 0 0 1  PHQ - 2 Score 0 0 0 0 2  Altered sleeping     0  Tired, decreased energy     0  Change in appetite     0  Feeling bad or failure about yourself      1  Trouble concentrating     0  Moving slowly or fidgety/restless     0  Suicidal thoughts     0  PHQ-9 Score     3       Assessment & Plan:    Patient Active Problem List   Diagnosis Date Noted   Syphilis 08/14/2023   Skin lesion 02/06/2023   Asymptomatic HIV infection (HCC) 10/29/2019   Healthcare maintenance 10/29/2019     Problem List Items Addressed This Visit       Other   Asymptomatic HIV infection (HCC) - Primary    Leonard Pham continues to have well controlled virus with good adherence and tolerance to Biktarvy.  Reviewed lab work and discussed plan of care, U equals U, and family planning. Check lab work. Continue current dose of Biktarvy. Plan for follow up in  6 months or sooner if needed with lab work on the same day.       Relevant Medications   bictegravir-emtricitabine-tenofovir AF (BIKTARVY) 50-200-25 MG TABS tablet   Other Relevant Orders   COMPLETE METABOLIC PANEL WITH GFR   HIV-1 RNA quant-no reflex-bld   T-helper cell (CD4)- ([facility] clinic only)   Healthcare maintenance    Discussed importance of safe sexual practice and condom use. Condoms and STD testing offered.  Vaccinations reviewed and declined. Will schedule routine dental care independently.        Syphilis    Leonard Pham's RPR titer was 1:8 with previous treatment at 1:64. Counseled on RPR testing and serofast and  when additional treatment needed. Check RPR.       Relevant Medications   bictegravir-emtricitabine-tenofovir AF (BIKTARVY) 50-200-25 MG TABS tablet   Other Relevant Orders   RPR   Other Visit Diagnoses     Screening for STDs (sexually transmitted diseases)       Relevant Orders   Cytology (oral, anal, urethral) ancillary only   Urine cytology ancillary  only   Cytology (oral, anal, urethral) ancillary only        I am having Leonard Pham "Leonard Pham" maintain his Biktarvy.   Meds ordered this encounter  Medications   bictegravir-emtricitabine-tenofovir AF (BIKTARVY) 50-200-25 MG TABS tablet    Sig: Take 1 tablet by mouth daily.    Dispense:  30 tablet    Refill:  5    Order Specific Question:   Supervising Provider    Answer:   Judyann Munson [phone]    Order Specific Question:   Prescription Type:    Answer:   Renewal     Follow-up: Return in about 6 months (around 02/12/2024). or sooner if needed.    Marcos Eke, MSN, FNP-C Nurse Practitioner [facility] for Infectious Disease [facility] Main number: [phone]

## 2023-08-15 LAB — T-HELPER CELL (CD4) - (RCID CLINIC ONLY)
CD4 % Helper T Cell: 31 % — ABNORMAL LOW (ref 33–65)
CD4 T Cell Abs: 757 /uL (ref 400–1790)

## 2023-08-16 LAB — URINE CYTOLOGY ANCILLARY ONLY
Chlamydia: NEGATIVE
Comment: NEGATIVE
Comment: NORMAL
Neisseria Gonorrhea: NEGATIVE

## 2023-08-16 LAB — CYTOLOGY, (ORAL, ANAL, URETHRAL) ANCILLARY ONLY
Chlamydia: NEGATIVE
Chlamydia: NEGATIVE
Comment: NEGATIVE
Comment: NEGATIVE
Comment: NORMAL
Comment: NORMAL
Neisseria Gonorrhea: NEGATIVE
Neisseria Gonorrhea: NEGATIVE

## 2023-08-17 LAB — COMPLETE METABOLIC PANEL WITH GFR
AG Ratio: 1.5 (calc) (ref 1.0–2.5)
ALT: 13 U/L (ref 9–46)
AST: 21 U/L (ref 10–40)
Albumin: 4.2 g/dL (ref 3.6–5.1)
Alkaline phosphatase (APISO): 65 U/L (ref 36–130)
BUN: 11 mg/dL (ref 7–25)
CO2: 28 mmol/L (ref 20–32)
Calcium: 9.4 mg/dL (ref 8.6–10.3)
Chloride: 104 mmol/L (ref 98–110)
Creat: 1.09 mg/dL (ref 0.60–1.24)
Globulin: 2.8 g/dL (ref 1.9–3.7)
Glucose, Bld: 90 mg/dL (ref 65–99)
Potassium: 4 mmol/L (ref 3.5–5.3)
Sodium: 139 mmol/L (ref 135–146)
Total Bilirubin: 1.2 mg/dL (ref 0.2–1.2)
Total Protein: 7 g/dL (ref 6.1–8.1)
eGFR: 96 mL/min/{1.73_m2} (ref >=60)

## 2023-08-17 LAB — HIV-1 RNA QUANT-NO REFLEX-BLD
HIV 1 RNA Quant: <20 {copies}/mL — ABNORMAL HIGH
HIV-1 RNA Quant, Log: <1.3 {Log_copies}/mL — ABNORMAL HIGH

## 2023-08-17 LAB — T PALLIDUM AB: T Pallidum Abs: POSITIVE — AB

## 2023-08-17 LAB — RPR: RPR Ser Ql: REACTIVE — AB

## 2023-08-17 LAB — RPR TITER: RPR Titer: 1:8 {titer} — ABNORMAL HIGH

## 2023-08-20 ENCOUNTER — Other Ambulatory Visit: Payer: Self-pay | Admitting: Family

## 2023-08-21 DIAGNOSIS — F411 Generalized anxiety disorder: Secondary | ICD-10-CM | POA: Diagnosis not present

## 2023-09-07 DIAGNOSIS — F411 Generalized anxiety disorder: Secondary | ICD-10-CM | POA: Diagnosis not present

## 2023-09-19 DIAGNOSIS — F411 Generalized anxiety disorder: Secondary | ICD-10-CM | POA: Diagnosis not present

## 2023-10-25 ENCOUNTER — Other Ambulatory Visit (HOSPITAL_COMMUNITY): Payer: Self-pay

## 2023-10-25 ENCOUNTER — Telehealth: Payer: Self-pay

## 2023-10-25 NOTE — Telephone Encounter (Signed)
 [facility] Patient Advocate Encounter   Was successful in obtaining a [employer] copay card for BIKTARVY .  This copay card will make the patients copay $0.  I have spoken with the patient.    The billing information is as follows and has been shared with [facility].  RxBin: [id number] PCN: [id number] Member ID: [id number] Group ID: [id number]    Charmaine Sharps, CPhT Specialty Pharmacy Patient [facility] for Infectious Disease Phone: [phone] Fax:  [phone]

## 2023-11-09 DIAGNOSIS — F411 Generalized anxiety disorder: Secondary | ICD-10-CM | POA: Diagnosis not present

## 2023-11-23 DIAGNOSIS — F4322 Adjustment disorder with anxiety: Secondary | ICD-10-CM | POA: Diagnosis not present

## 2023-12-07 DIAGNOSIS — F4322 Adjustment disorder with anxiety: Secondary | ICD-10-CM | POA: Diagnosis not present

## 2023-12-21 DIAGNOSIS — F4322 Adjustment disorder with anxiety: Secondary | ICD-10-CM | POA: Diagnosis not present

## 2024-01-04 DIAGNOSIS — F4322 Adjustment disorder with anxiety: Secondary | ICD-10-CM | POA: Diagnosis not present

## 2024-01-25 DIAGNOSIS — F4322 Adjustment disorder with anxiety: Secondary | ICD-10-CM | POA: Diagnosis not present

## 2024-01-26 DIAGNOSIS — R002 Palpitations: Secondary | ICD-10-CM | POA: Diagnosis not present

## 2024-01-26 DIAGNOSIS — R0683 Snoring: Secondary | ICD-10-CM | POA: Diagnosis not present

## 2024-01-29 ENCOUNTER — Ambulatory Visit: Payer: BC Managed Care – PPO | Admitting: Family

## 2024-01-29 ENCOUNTER — Other Ambulatory Visit: Payer: Self-pay

## 2024-01-29 ENCOUNTER — Other Ambulatory Visit (HOSPITAL_COMMUNITY)
Admission: RE | Admit: 2024-01-29 | Discharge: 2024-01-29 | Disposition: A | Discharge status: Home / Self Care | Source: Ambulatory Visit | Attending: Family | Admitting: Family

## 2024-01-29 ENCOUNTER — Encounter: Payer: Self-pay | Admitting: Family

## 2024-01-29 VITALS — BP 124/74 | HR 96 | Temp 98.3°F | Ht 67.0 in | Wt 150.0 lb

## 2024-01-29 DIAGNOSIS — Z113 Encounter for screening for infections with a predominantly sexual mode of transmission: Secondary | ICD-10-CM

## 2024-01-29 DIAGNOSIS — F419 Anxiety disorder, unspecified: Secondary | ICD-10-CM

## 2024-01-29 DIAGNOSIS — F32A Depression, unspecified: Secondary | ICD-10-CM

## 2024-01-29 DIAGNOSIS — Z21 Asymptomatic human immunodeficiency virus [HIV] infection status: Secondary | ICD-10-CM | POA: Diagnosis not present

## 2024-01-29 DIAGNOSIS — Z0000 Encounter for general adult medical examination without abnormal findings: Secondary | ICD-10-CM

## 2024-01-29 DIAGNOSIS — A539 Syphilis, unspecified: Secondary | ICD-10-CM | POA: Diagnosis not present

## 2024-01-29 NOTE — Assessment & Plan Note (Signed)
 GAD-7 score of 13 indicating moderate anxiety.  Currently in counseling with no indications for medications.

## 2024-01-29 NOTE — Assessment & Plan Note (Signed)
 PHQ-9 score of 9 indicating mild depression.  Working with counseling.  No suicidal ideations or signs of psychosis.  No indications for medication.

## 2024-01-29 NOTE — Progress Notes (Signed)
 Brief Narrative   Patient ID: Leonard Pham, male    DOB: 26-Aug-1996, 28 y.o.   MRN: [id number]  Mr. Hambly is a 28 y/o AA male diagnosed with HIV in December 2020 with risk factor of MSM. CD4 nair of 277 with initial viral load of 99,000. Entered care at [facility] Stage 2. Genotype with no significant mutations. No history of opportunistic infection.  [id number] negative. No previous regimens prior to [employer].   Subjective:    Chief Complaint  Patient presents with   Follow-up    B20    HPI:  Leonard Pham is a 28 y.o. male with HIV disease last seen on 08/14/2023 with well-controlled virus and good adherence and tolerance to [employer].  Viral load was undetectable with CD4 count 757.  Renal function, hepatic function, and electrolytes within normal ranges.  RPR titer stable at 1: 8 with previous treatment at 1: 64.  Site-specific STD testing negative.  Here today for routine follow-up.  Leonard Pham has been doing well since his last office visit and continues to take Biktarvy as prescribed with no adverse side effects or problems obtaining medication from the pharmacy.  Covered by [facility].  Wishing to change pharmacies to [facility].  Continues to work full-time.  Since last office visit has been seen by cardiology for workup of tachycardia which remains in progress.  Site-specific STD testing offered.  Healthcare maintenance reviewed.  Due for routine dental care which he will schedule independently.  Housing, access to food, and transportation are stable.  Denies fevers, chills, night sweats, headaches, changes in vision, neck pain/stiffness, nausea, diarrhea, vomiting, lesions or rashes.  Lab Results  Component Value Date   CD4TCELL 31 (L) 08/14/2023   CD4TABS 757 08/14/2023   Lab Results  Component Value Date   HIV1RNAQUANT <20 (H) 08/14/2023     No Known Allergies    Outpatient Medications Prior to Visit  Medication Sig Dispense Refill    bictegravir-emtricitabine-tenofovir AF (BIKTARVY) 50-200-25 MG TABS tablet Take 1 tablet by mouth daily. 30 tablet 5   No facility-administered medications prior to visit.     Past Medical History:  Diagnosis Date   Asthma    had asthma as young child but no symptoms or treatment for 'years'   HIV (human immunodeficiency virus infection) (HCC) 10/29/2019   Dx 09/2019     History reviewed. No pertinent surgical history.    Review of Systems  Constitutional:  Negative for appetite change, chills, fatigue, fever and unexpected weight change.  Eyes:  Negative for visual disturbance.  Respiratory:  Negative for cough, chest tightness, shortness of breath and wheezing.   Cardiovascular:  Negative for chest pain and leg swelling.  Gastrointestinal:  Negative for abdominal pain, constipation, diarrhea, nausea and vomiting.  Genitourinary:  Negative for dysuria, flank pain, frequency, genital sores, hematuria and urgency.  Skin:  Negative for rash.  Allergic/Immunologic: Negative for immunocompromised state.  Neurological:  Negative for dizziness and headaches.      Objective:    BP 124/74   Pulse 96   Temp 98.3 F (36.8 C) (Temporal)   Ht 5\' 7"  (1.702 m)   Wt 150 lb (68 kg)   SpO2 97%   BMI 23.49 kg/m  Nursing note and vital signs reviewed.  Physical Exam Constitutional:      General: He is not in acute distress.    Appearance: He is well-developed.  Eyes:     Conjunctiva/sclera: Conjunctivae normal.  Cardiovascular:  Rate and Rhythm: Normal rate and regular rhythm.     Heart sounds: Normal heart sounds. No murmur heard.    No friction rub. No gallop.  Pulmonary:     Effort: Pulmonary effort is normal. No respiratory distress.     Breath sounds: Normal breath sounds. No wheezing or rales.  Chest:     Chest wall: No tenderness.  Abdominal:     General: Bowel sounds are normal.     Palpations: Abdomen is soft.     Tenderness: There is no abdominal tenderness.   Musculoskeletal:     Cervical back: Neck supple.  Lymphadenopathy:     Cervical: No cervical adenopathy.  Skin:    General: Skin is warm and dry.     Findings: No rash.  Neurological:     Mental Status: He is alert and oriented to person, place, and time.  Psychiatric:        Behavior: Behavior normal.        Thought Content: Thought content normal.        Judgment: Judgment normal.         01/29/2024    3:02 PM 08/14/2023    3:30 PM 02/06/2023    2:17 PM 08/15/2022    1:36 PM 01/14/2021    3:36 PM  Depression screen PHQ 2/9  Decreased Interest 1 0 0 0 0  Down, Depressed, Hopeless 1 0 0 0 0  PHQ - 2 Score 2 0 0 0 0  Altered sleeping 2      Tired, decreased energy 3      Change in appetite 0      Feeling bad or failure about yourself  2      Trouble concentrating 0      Moving slowly or fidgety/restless 0      Suicidal thoughts 0      PHQ-9 Score 9      Difficult doing work/chores Somewhat difficult            01/29/2024    3:03 PM  GAD 7 : Generalized Anxiety Score  Nervous, Anxious, on Edge 2  Control/stop worrying 2  Worry too much - different things 3  Trouble relaxing 2  Restless 1  Easily annoyed or irritable 2  Afraid - awful might happen 1  Total GAD 7 Score 13  Anxiety Difficulty Not difficult at all         Assessment & Plan:    Patient Active Problem List   Diagnosis Date Noted   Mild depression 01/29/2024   Moderate anxiety 01/29/2024   Syphilis 08/14/2023   Skin lesion 02/06/2023   Asymptomatic HIV infection (HCC) 10/29/2019   Healthcare maintenance 10/29/2019     Problem List Items Addressed This Visit       Other   Asymptomatic HIV infection (HCC) - Primary   Leonard Pham continues to have well-controlled virus with good adherence and tolerance to Biktarvy.  Reviewed previous lab work and discussed plan of care and U equals U.  No problems obtaining medication from the pharmacy.  Social determinants of health reviewed with no  interventions indicated.  Check blood work.  Continue current dose of Biktarvy.  Plan for follow-up in 6 months or sooner if needed with lab work on the same day.      Relevant Orders   COMPLETE METABOLIC PANEL WITHOUT GFR   HIV-1 RNA quant-no reflex-bld   T-helper cell (CD4)- ([facility] clinic only)   Healthcare maintenance   Discussed importance  of safe sexual practice and condom use. Condoms and site specific STD testing offered.  Vaccinations reviewed and followed counseling declined. Due for routine dental care and will complete independently.  Offered referral to [facility] which was declined.       Syphilis   Most recent RPR 1: 8 remains stable from treatment level of 1.64 previously.  Discussed nature of RPR testing with no indication for treatment at this time.  Check RPR.      Mild depression   PHQ-9 score of 9 indicating mild depression.  Working with counseling.  No suicidal ideations or signs of psychosis.  No indications for medication.      Moderate anxiety   GAD-7 score of 13 indicating moderate anxiety.  Currently in counseling with no indications for medications.      Other Visit Diagnoses       Screening for STDs (sexually transmitted diseases)       Relevant Orders   RPR   Urine cytology ancillary only        I am having Hester D. Kemple Pham "Leonard Pham" maintain his Biktarvy.    Follow-up: Return in about 6 months (around 07/30/2024). or sooner if needed.    Marlan Silva, MSN, FNP-C Nurse Practitioner [facility] for Infectious Disease [facility] Main number: [phone]

## 2024-01-29 NOTE — Patient Instructions (Addendum)
 Nice to see you. ? ?We will check your lab work today. ? ?Continue to take your medication daily as prescribed. ? ?Refills have been sent to the pharmacy. ? ?Plan for follow up in 6 months or sooner if needed with lab work on the same day. ? ?Have a great day and stay safe! ? ?

## 2024-01-29 NOTE — Assessment & Plan Note (Signed)
 Leonard Pham continues to have well-controlled virus with good adherence and tolerance to [employer].  Reviewed previous lab work and discussed plan of care and U equals U.  No problems obtaining medication from the pharmacy.  Social determinants of health reviewed with no interventions indicated.  Check blood work.  Continue current dose of Biktarvy.  Plan for follow-up in 6 months or sooner if needed with lab work on the same day.

## 2024-01-29 NOTE — Assessment & Plan Note (Signed)
 Discussed importance of safe sexual practice and condom use. Condoms and site specific STD testing offered.  Vaccinations reviewed and followed counseling declined. Due for routine dental care and will complete independently.  Offered referral to [facility] which was declined.

## 2024-01-29 NOTE — Assessment & Plan Note (Signed)
 Most recent RPR 1: 8 remains stable from treatment level of 1.64 previously.  Discussed nature of RPR testing with no indication for treatment at this time.  Check RPR.

## 2024-01-30 LAB — URINE CYTOLOGY ANCILLARY ONLY
Chlamydia: NEGATIVE
Comment: NEGATIVE
Comment: NORMAL
Neisseria Gonorrhea: NEGATIVE

## 2024-01-30 LAB — T-HELPER CELL (CD4) - (RCID CLINIC ONLY)
CD4 % Helper T Cell: 37 % (ref 33–65)
CD4 T Cell Abs: 732 /uL (ref 400–1790)

## 2024-02-01 DIAGNOSIS — R002 Palpitations: Secondary | ICD-10-CM | POA: Diagnosis not present

## 2024-02-01 DIAGNOSIS — R0683 Snoring: Secondary | ICD-10-CM | POA: Diagnosis not present

## 2024-02-01 LAB — COMPLETE METABOLIC PANEL WITHOUT GFR
AG Ratio: 1.7 (calc) (ref 1.0–2.5)
ALT: 21 U/L (ref 9–46)
AST: 20 U/L (ref 10–40)
Albumin: 4.5 g/dL (ref 3.6–5.1)
Alkaline phosphatase (APISO): 57 U/L (ref 36–130)
BUN: 8 mg/dL (ref 7–25)
CO2: 30 mmol/L (ref 20–32)
Calcium: 9.8 mg/dL (ref 8.6–10.3)
Chloride: 99 mmol/L (ref 98–110)
Creat: 0.95 mg/dL (ref 0.60–1.24)
Globulin: 2.6 g/dL (ref 1.9–3.7)
Glucose, Bld: 116 mg/dL — ABNORMAL HIGH (ref 65–99)
Potassium: 3.8 mmol/L (ref 3.5–5.3)
Sodium: 136 mmol/L (ref 135–146)
Total Bilirubin: 1.1 mg/dL (ref 0.2–1.2)
Total Protein: 7.1 g/dL (ref 6.1–8.1)

## 2024-02-01 LAB — HIV-1 RNA QUANT-NO REFLEX-BLD
HIV 1 RNA Quant: <20 {copies}/mL — AB
HIV-1 RNA Quant, Log: <1.3 {Log_copies}/mL — AB

## 2024-02-01 LAB — T PALLIDUM AB: T Pallidum Abs: POSITIVE — AB

## 2024-02-01 LAB — RPR: RPR Ser Ql: REACTIVE — AB

## 2024-02-01 LAB — RPR TITER: RPR Titer: 1:8 {titer} — ABNORMAL HIGH

## 2024-02-07 DIAGNOSIS — F4322 Adjustment disorder with anxiety: Secondary | ICD-10-CM | POA: Diagnosis not present

## 2024-02-09 DIAGNOSIS — R002 Palpitations: Secondary | ICD-10-CM | POA: Diagnosis not present

## 2024-02-09 DIAGNOSIS — R0683 Snoring: Secondary | ICD-10-CM | POA: Diagnosis not present

## 2024-02-23 DIAGNOSIS — Z6822 Body mass index (BMI) 22.0-22.9, adult: Secondary | ICD-10-CM | POA: Diagnosis not present

## 2024-02-23 DIAGNOSIS — S81001A Unspecified open wound, right knee, initial encounter: Secondary | ICD-10-CM | POA: Diagnosis not present

## 2024-02-26 ENCOUNTER — Other Ambulatory Visit: Payer: Self-pay | Admitting: Family

## 2024-02-28 DIAGNOSIS — F4322 Adjustment disorder with anxiety: Secondary | ICD-10-CM | POA: Diagnosis not present

## 2024-03-25 ENCOUNTER — Telehealth: Payer: Self-pay

## 2024-03-25 ENCOUNTER — Other Ambulatory Visit (HOSPITAL_COMMUNITY): Payer: Self-pay

## 2024-03-25 DIAGNOSIS — F4322 Adjustment disorder with anxiety: Secondary | ICD-10-CM | POA: Diagnosis not present

## 2024-03-25 NOTE — Telephone Encounter (Signed)
 Patient walked into clinic today requesting samples for Biktarvy . States that he lost his bag that had his medication in it while out of town. Is not able to get to pharmacy. Per pharmacy team insurance should cover Biktarvy  tomorrow.  Will provide 7 day sample today. Will call back with any questions.  Julien Odor, RMA

## 2024-04-01 ENCOUNTER — Other Ambulatory Visit: Payer: Self-pay | Admitting: Pharmacist

## 2024-04-01 ENCOUNTER — Other Ambulatory Visit (HOSPITAL_COMMUNITY): Payer: Self-pay

## 2024-04-01 DIAGNOSIS — Z21 Asymptomatic human immunodeficiency virus [HIV] infection status: Secondary | ICD-10-CM

## 2024-04-01 MED ORDER — BIKTARVY 50-200-25 MG PO TABS: 1.0000 | ORAL_TABLET | Freq: Every day | ORAL | Status: AC

## 2024-04-01 NOTE — Progress Notes (Signed)
 Medication Samples have been provided to the patient.  Drug name: Biktarvy         Strength: 50/200/25 mg       Qty: 1 bottle (7 tablets)   LOT: [id number]   Exp.Date: 05/16/25  Samples requested by Betha Brookes, CMA.  Dosing instructions: Take one tablet by mouth once daily  The patient has been instructed regarding the correct time, dose, and frequency of taking this medication, including desired effects and most common side effects.   Niquita Digioia L. Margart Shears, PharmD, BCIDP, AAHIVP, CPP Clinical Pharmacist Practitioner Infectious Diseases Clinical Pharmacist [facility] 09/28/2020, 10:07 AM

## 2024-05-15 DIAGNOSIS — F4322 Adjustment disorder with anxiety: Secondary | ICD-10-CM | POA: Diagnosis not present

## 2024-06-05 DIAGNOSIS — F4322 Adjustment disorder with anxiety: Secondary | ICD-10-CM | POA: Diagnosis not present

## 2024-06-26 DIAGNOSIS — F411 Generalized anxiety disorder: Secondary | ICD-10-CM | POA: Diagnosis not present

## 2024-07-25 ENCOUNTER — Ambulatory Visit: Admitting: Family

## 2024-08-07 ENCOUNTER — Other Ambulatory Visit: Payer: Self-pay | Admitting: Family

## 2024-08-07 DIAGNOSIS — Z21 Asymptomatic human immunodeficiency virus [HIV] infection status: Secondary | ICD-10-CM

## 2024-08-14 ENCOUNTER — Other Ambulatory Visit (HOSPITAL_COMMUNITY): Payer: Self-pay

## 2024-08-14 ENCOUNTER — Telehealth: Payer: Self-pay

## 2024-08-14 DIAGNOSIS — Z21 Asymptomatic human immunodeficiency virus [HIV] infection status: Secondary | ICD-10-CM

## 2024-08-14 MED ORDER — BIKTARVY 50-200-25 MG PO TABS: 1.0000 | ORAL_TABLET | Freq: Every day | ORAL | 0 refills | Status: DC

## 2024-08-14 NOTE — Telephone Encounter (Signed)
 Leonard Pham with pharmacy informed that pt is having issues filling Biktarvy  at [facility]. Pharmacy claims they do not have script. Will send in refill to [facility] in [address] Pt would like to call back to schedule follow up appt around 310, understands that we can only send 30 day supply until appt is scheduled.  Lorenda CHRISTELLA Code, RMA

## 2024-08-15 ENCOUNTER — Other Ambulatory Visit (HOSPITAL_COMMUNITY): Payer: Self-pay

## 2024-08-19 ENCOUNTER — Other Ambulatory Visit (HOSPITAL_COMMUNITY): Payer: Self-pay

## 2024-08-22 ENCOUNTER — Other Ambulatory Visit (HOSPITAL_COMMUNITY): Payer: Self-pay

## 2024-09-17 DIAGNOSIS — F411 Generalized anxiety disorder: Secondary | ICD-10-CM | POA: Diagnosis not present

## 2024-09-20 ENCOUNTER — Other Ambulatory Visit: Payer: Self-pay | Admitting: Family

## 2024-09-20 DIAGNOSIS — Z21 Asymptomatic human immunodeficiency virus [HIV] infection status: Secondary | ICD-10-CM

## 2024-09-23 ENCOUNTER — Telehealth: Payer: Self-pay | Admitting: Family

## 2024-09-23 NOTE — Telephone Encounter (Signed)
 Refill was sent this morning.   Biana Haggar, BSN, RN

## 2024-09-23 NOTE — Telephone Encounter (Signed)
 Leonard Pham called to schedule an appt with [name]. Pt also requested a Biktarvy  refill to [facility] in [place]. Pt is scheduled 12/31.

## 2024-10-16 ENCOUNTER — Ambulatory Visit (INDEPENDENT_AMBULATORY_CARE_PROVIDER_SITE_OTHER): Admitting: Family

## 2024-10-16 ENCOUNTER — Other Ambulatory Visit: Payer: Self-pay

## 2024-10-16 ENCOUNTER — Encounter: Payer: Self-pay | Admitting: Family

## 2024-10-16 VITALS — BP 119/79 | HR 83 | Temp 97.8°F | Ht 67.0 in | Wt 144.0 lb

## 2024-10-16 DIAGNOSIS — B20 Human immunodeficiency virus [HIV] disease: Secondary | ICD-10-CM

## 2024-10-16 DIAGNOSIS — Z0000 Encounter for general adult medical examination without abnormal findings: Secondary | ICD-10-CM

## 2024-10-16 DIAGNOSIS — Z113 Encounter for screening for infections with a predominantly sexual mode of transmission: Secondary | ICD-10-CM

## 2024-10-16 DIAGNOSIS — A539 Syphilis, unspecified: Secondary | ICD-10-CM | POA: Diagnosis not present

## 2024-10-16 DIAGNOSIS — Z21 Asymptomatic human immunodeficiency virus [HIV] infection status: Secondary | ICD-10-CM

## 2024-10-16 MED ORDER — BIKTARVY 50-200-25 MG PO TABS: 1.0000 | ORAL_TABLET | Freq: Every day | ORAL | 5 refills | Status: AC

## 2024-10-16 NOTE — Assessment & Plan Note (Signed)
 Syphilis titer stable at 1: 8 with no evidence of new infection or indications for treatment.  Check RPR.  Discussed nature of RPR testing and when treatment would be appropriate.

## 2024-10-16 NOTE — Progress Notes (Signed)
 "   Brief Narrative   Patient ID: Brix [name] Stembridge III, male    DOB: Mar 04, 1996, 28 y.o.   MRN: [id number]  Mr. Wakefield is a 28 y/o AA male diagnosed with HIV in December 2020 with risk factor of MSM. CD4 nair of 277 with initial viral load of 99,000. Entered care at [facility] Stage 2. Genotype with no significant mutations. No history of opportunistic infection.  [id number] negative. No previous regimens prior to Biktarvy .   Subjective:   Chief Complaint  Patient presents with   Follow-up    B20    HPI:  Dacotah [name] Nghiem III is a 28 y.o. male with HIV disease last seen on 01/29/2024 with well-controlled virus and good adherence and tolerance to Biktarvy .  Viral load was undetectable with CD4 count 732.  RPR was stable at 1: 8.  Kidney function, liver function, electrolytes within normal ranges.  Here today for routine follow-up.  Mr. Kingsley has been doing well since his last office visit and continues to take Biktarvy  as prescribed with no adverse side effects.  Missed approximately 1 week of medications since he was unable to get it from the pharmacy which should be resolved at this point.  Covered by [employer].  Working full-time with stable housing, transportation, and access to food.  Healthcare maintenance reviewed.  Due for routine dental care.  Currently sexually active with condoms and site-specific STD testing offered.  Denies fevers, chills, night sweats, headaches, changes in vision, neck pain/stiffness, nausea, diarrhea, vomiting, lesions or rashes.  Lab Results  Component Value Date   CD4TCELL 37 01/29/2024   CD4TABS 732 01/29/2024   Lab Results  Component Value Date   HIV1RNAQUANT <20 DETECTED (A) 01/29/2024     Allergies[1]    Outpatient Medications Prior to Visit  Medication Sig Dispense Refill   BIKTARVY  50-200-25 MG TABS tablet TAKE 1 TABLET BY MOUTH DAILY 30 tablet 0   No facility-administered medications prior to visit.     Past Medical  History:  Diagnosis Date   Asthma    had asthma as young child but no symptoms or treatment for 'years'   HIV (human immunodeficiency virus infection) (HCC) 10/29/2019   Dx 09/2019     History reviewed. No pertinent surgical history.      Review of Systems  Constitutional:  Negative for appetite change, chills, fatigue, fever and unexpected weight change.  Eyes:  Negative for visual disturbance.  Respiratory:  Negative for cough, chest tightness, shortness of breath and wheezing.   Cardiovascular:  Negative for chest pain and leg swelling.  Gastrointestinal:  Negative for abdominal pain, constipation, diarrhea, nausea and vomiting.  Genitourinary:  Negative for dysuria, flank pain, frequency, genital sores, hematuria and urgency.  Skin:  Negative for rash.  Allergic/Immunologic: Negative for immunocompromised state.  Neurological:  Negative for dizziness and headaches.     Objective:   BP 119/79   Pulse 83   Temp 97.8 F (36.6 C) (Temporal)   Ht 5' 7 (1.702 m)   Wt 144 lb (65.3 kg)   SpO2 98%   BMI 22.55 kg/m  Nursing note and vital signs reviewed.  Physical Exam Constitutional:      General: He is not in acute distress.    Appearance: He is well-developed.  Eyes:     Conjunctiva/sclera: Conjunctivae normal.  Cardiovascular:     Rate and Rhythm: Normal rate and regular rhythm.     Heart sounds: Normal heart sounds. No murmur heard.  No friction rub. No gallop.  Pulmonary:     Effort: Pulmonary effort is normal. No respiratory distress.     Breath sounds: Normal breath sounds. No wheezing or rales.  Chest:     Chest wall: No tenderness.  Abdominal:     General: Bowel sounds are normal.     Palpations: Abdomen is soft.     Tenderness: There is no abdominal tenderness.  Musculoskeletal:     Cervical back: Neck supple.  Lymphadenopathy:     Cervical: No cervical adenopathy.  Skin:    General: Skin is warm and dry.     Findings: No rash.  Neurological:      Mental Status: He is alert and oriented to person, place, and time.          01/29/2024    3:02 PM 08/14/2023    3:30 PM 02/06/2023    2:17 PM 08/15/2022    1:36 PM 01/14/2021    3:36 PM  Depression screen PHQ 2/9  Decreased Interest 1 0 0 0 0  Down, Depressed, Hopeless 1 0 0 0 0  PHQ - 2 Score 2 0 0 0 0  Altered sleeping 2      Tired, decreased energy 3      Change in appetite 0      Feeling bad or failure about yourself  2      Trouble concentrating 0      Moving slowly or fidgety/restless 0      Suicidal thoughts 0      PHQ-9 Score 9       Difficult doing work/chores Somewhat difficult         Data saved with a previous flowsheet row definition        01/29/2024    3:03 PM  GAD 7 : Generalized Anxiety Score  Nervous, Anxious, on Edge 2  Control/stop worrying 2  Worry too much - different things 3  Trouble relaxing 2  Restless 1  Easily annoyed or irritable 2  Afraid - awful might happen 1  Total GAD 7 Score 13  Anxiety Difficulty Not difficult at all         Assessment & Plan:    Patient Active Problem List   Diagnosis Date Noted   Mild depression 01/29/2024   Moderate anxiety 01/29/2024   Syphilis 08/14/2023   Skin lesion 02/06/2023   Asymptomatic HIV infection (HCC) 10/29/2019   Healthcare maintenance 10/29/2019     Problem List Items Addressed This Visit       Other   Asymptomatic HIV infection (HCC) - Primary   Mr. Jama continues to have well-controlled virus with good adherence and tolerance to Biktarvy .  Reviewed previous lab work and discussed plan of care and U equals U.  Problems with obtaining medication from the pharmacy should be resolved.  Covered by [facility].  Social determinants of health reviewed with no interventions indicated.  Check blood work.  Continue current dose of Biktarvy .  Plan for follow-up in 6 months or sooner if needed with lab work on the same day.      Relevant Medications    bictegravir-emtricitabine-tenofovir AF (BIKTARVY ) 50-200-25 MG TABS tablet   Other Relevant Orders   Comprehensive metabolic panel with GFR   T-helper cells (CD4) count (not at [facility])   HIV-1 RNA quant-no reflex-bld   Healthcare maintenance   Discussed importance of safe sexual practice and condom use. Condoms and site specific STD testing offered.  Vaccinations reviewed and  declined following counseling. Due for routine dental care.  He will schedule this independently.      Syphilis   Syphilis titer stable at 1: 8 with no evidence of new infection or indications for treatment.  Check RPR.  Discussed nature of RPR testing and when treatment would be appropriate.      Relevant Medications   bictegravir-emtricitabine-tenofovir AF (BIKTARVY ) 50-200-25 MG TABS tablet   Other Visit Diagnoses       Screening for STDs (sexually transmitted diseases)       Relevant Orders   RPR W/RFLX TO RPR TITER, TREPONEMAL AB, SCREEN AND DIAGNOSIS   CT/NG RNA, TMA Rectal   GC/CT Probe, Amp (Throat)   C. trachomatis/N. gonorrhoeae RNA        I have changed Seab D. Torbert III [name]'s Biktarvy .   Meds ordered this encounter  Medications   bictegravir-emtricitabine-tenofovir AF (BIKTARVY ) 50-200-25 MG TABS tablet    Sig: Take 1 tablet by mouth daily.    Dispense:  30 tablet    Refill:  5    Supervising Provider:   SNIDER, CYNTHIA [phone]    Prescription Type::   Renewal     Follow-up: Return in about 6 months (around 04/15/2025). or sooner if needed.    [name] July, MSN, FNP-C Nurse Practitioner [facility] for Infectious Disease [facility] Main number: [phone]      [1] No Known Allergies  "

## 2024-10-16 NOTE — Assessment & Plan Note (Signed)
 Discussed importance of safe sexual practice and condom use. Condoms and site specific STD testing offered.  Vaccinations reviewed and declined following counseling. Due for routine dental care.  He will schedule this independently.

## 2024-10-16 NOTE — Patient Instructions (Signed)
 Nice to see you. ? ?We will check your lab work today. ? ?Continue to take your medication daily as prescribed. ? ?Refills have been sent to the pharmacy. ? ?Plan for follow up in 6 months or sooner if needed with lab work on the same day. ? ?Have a great day and stay safe! ? ?

## 2024-10-16 NOTE — Assessment & Plan Note (Signed)
 Leonard Pham continues to have well-controlled virus with good adherence and tolerance to Biktarvy .  Reviewed previous lab work and discussed plan of care and U equals U.  Problems with obtaining medication from the pharmacy should be resolved.  Covered by [facility].  Social determinants of health reviewed with no interventions indicated.  Check blood work.  Continue current dose of Biktarvy .  Plan for follow-up in 6 months or sooner if needed with lab work on the same day.

## 2024-10-18 LAB — C. TRACHOMATIS/N. GONORRHOEAE RNA
C. trachomatis RNA, TMA: NOT DETECTED
N. gonorrhoeae RNA, TMA: NOT DETECTED

## 2024-10-18 LAB — CT/NG RNA, TMA RECTAL
Chlamydia Trachomatis RNA: NOT DETECTED
Neisseria Gonorrhoeae RNA: NOT DETECTED

## 2024-10-18 LAB — GC/CHLAMYDIA PROBE, AMP (THROAT)
Chlamydia trachomatis RNA: NOT DETECTED
Neisseria gonorrhoeae RNA: NOT DETECTED

## 2024-10-23 LAB — COMPREHENSIVE METABOLIC PANEL WITH GFR
AG Ratio: 1.6 (calc) (ref 1.0–2.5)
ALT: 13 U/L (ref 9–46)
AST: 18 U/L (ref 10–40)
Albumin: 4.7 g/dL (ref 3.6–5.1)
Alkaline phosphatase (APISO): 62 U/L (ref 36–130)
BUN/Creatinine Ratio: 7 (calc) (ref 6–22)
BUN: 6 mg/dL — ABNORMAL LOW (ref 7–25)
CO2: 28 mmol/L (ref 20–32)
Calcium: 10.1 mg/dL (ref 8.6–10.3)
Chloride: 101 mmol/L (ref 98–110)
Creat: 0.87 mg/dL (ref 0.60–1.24)
Globulin: 2.9 g/dL (ref 1.9–3.7)
Glucose, Bld: 91 mg/dL (ref 65–99)
Potassium: 4.2 mmol/L (ref 3.5–5.3)
Sodium: 138 mmol/L (ref 135–146)
Total Bilirubin: 1.2 mg/dL (ref 0.2–1.2)
Total Protein: 7.6 g/dL (ref 6.1–8.1)
eGFR: 121 mL/min/1.73m2

## 2024-10-23 LAB — SYPHILIS: RPR W/REFLEX TO RPR TITER AND TREPONEMAL ANTIBODIES, TRADITIONAL SCREENING AND DIAGNOSIS ALGORITHM: RPR Ser Ql: REACTIVE — AB

## 2024-10-23 LAB — HIV-1 RNA QUANT-NO REFLEX-BLD
HIV 1 RNA Quant: <20 {copies}/mL — AB
HIV-1 RNA Quant, Log: <1.3 {Log_copies}/mL — AB

## 2024-10-23 LAB — T-HELPER CELLS (CD4) COUNT (NOT AT ARMC)
Absolute CD4: 914 {cells}/uL (ref 490–1740)
CD4 T Helper %: 27 % — ABNORMAL LOW (ref 30–61)
Total lymphocyte count: 3371 {cells}/uL (ref 850–3900)

## 2024-10-23 LAB — T PALLIDUM AB: T Pallidum Abs: POSITIVE — AB

## 2024-10-23 LAB — RPR TITER: RPR Titer: 1:16 {titer} — ABNORMAL HIGH

## 2024-11-08 ENCOUNTER — Ambulatory Visit: Payer: Self-pay | Admitting: Family

## 2025-03-31 ENCOUNTER — Ambulatory Visit: Payer: Self-pay | Admitting: Family
